# Patient Record
Sex: Female | Born: 1970
Health system: Southern US, Community
[De-identification: ages and names within clinical notes are randomized; demographics above are authoritative.]

## PROBLEM LIST (undated history)

## (undated) DIAGNOSIS — G4720 Circadian rhythm sleep disorder, unspecified type: Secondary | ICD-10-CM

## (undated) DIAGNOSIS — I10 Essential (primary) hypertension: Secondary | ICD-10-CM

## (undated) DIAGNOSIS — F419 Anxiety disorder, unspecified: Secondary | ICD-10-CM

## (undated) DIAGNOSIS — F988 Other specified behavioral and emotional disorders with onset usually occurring in childhood and adolescence: Secondary | ICD-10-CM

## (undated) DIAGNOSIS — R6882 Decreased libido: Secondary | ICD-10-CM

## (undated) HISTORY — DX: Anxiety disorder, unspecified: F41.9

## (undated) HISTORY — DX: Decreased libido: R68.82

## (undated) HISTORY — DX: Circadian rhythm sleep disorder, unspecified type: G47.20

---

## 2009-04-16 HISTORY — PX: BREAST BIOPSY: SHX20

## 2009-06-09 ENCOUNTER — Ambulatory Visit: Payer: Self-pay

## 2009-06-14 ENCOUNTER — Ambulatory Visit: Payer: Self-pay

## 2009-07-06 ENCOUNTER — Ambulatory Visit: Payer: Self-pay | Admitting: Surgery

## 2012-04-02 ENCOUNTER — Ambulatory Visit: Payer: Self-pay | Admitting: Internal Medicine

## 2012-04-15 ENCOUNTER — Ambulatory Visit: Payer: Self-pay | Admitting: Internal Medicine

## 2014-02-12 ENCOUNTER — Ambulatory Visit: Payer: Self-pay | Admitting: Obstetrics and Gynecology

## 2014-02-26 ENCOUNTER — Ambulatory Visit: Payer: Self-pay | Admitting: Obstetrics and Gynecology

## 2014-04-16 LAB — HM MAMMOGRAPHY

## 2014-05-28 LAB — HM PAP SMEAR: HM Pap smear: NEGATIVE

## 2015-05-30 ENCOUNTER — Encounter: Payer: Self-pay | Admitting: *Deleted

## 2015-06-03 ENCOUNTER — Ambulatory Visit (INDEPENDENT_AMBULATORY_CARE_PROVIDER_SITE_OTHER): Payer: 59 | Admitting: Obstetrics and Gynecology

## 2015-06-03 ENCOUNTER — Encounter: Payer: Self-pay | Admitting: Obstetrics and Gynecology

## 2015-06-03 VITALS — BP 150/87 | HR 70 | Ht 63.5 in | Wt 141.0 lb

## 2015-06-03 DIAGNOSIS — Z01419 Encounter for gynecological examination (general) (routine) without abnormal findings: Secondary | ICD-10-CM | POA: Diagnosis not present

## 2015-06-03 NOTE — Progress Notes (Signed)
Subjective:   Jennifer Rowland is a 45 y.o. No obstetric history on file. Caucasian female here for a routine well-woman exam.  Patient's last menstrual period was 05/18/2015 (exact date).    Current complaints: none PCP: me       Does need routine screening labs  Social History: Sexual: heterosexual Marital Status: married Living situation: with spouse Occupation: unknown occupation Tobacco/alcohol: no tobacco use Illicit drugs: no history of illicit drug use  The following portions of the patient's history were reviewed and updated as appropriate: allergies, current medications, past family history, past medical history, past social history, past surgical history and problem list.  Past Medical History Past Medical History  Diagnosis Date  . Anxiety   . Libido, decreased   . Sleep pattern disturbance     Past Surgical History Past Surgical History  Procedure Laterality Date  . Cesarean section  1997    Gynecologic History No obstetric history on file.  Patient's last menstrual period was 05/18/2015 (exact date). Contraception: vasectomy Last Pap: 2016. Results were: normal Last mammogram: 2016. Results were: normal   Obstetric History OB History  No data available    Current Medications No current outpatient prescriptions on file prior to visit.   No current facility-administered medications on file prior to visit.    Review of Systems Patient denies any headaches, blurred vision, shortness of breath, chest pain, abdominal pain, problems with bowel movements, urination, or intercourse.  Objective:  BP 150/87 mmHg  Pulse 70  Ht 5' 3.5" (1.613 m)  Wt 141 lb (63.957 kg)  BMI 24.58 kg/m2  LMP 05/18/2015 (Exact Date) Physical Exam  General:  Well developed, well nourished, no acute distress. She is alert and oriented x3. Skin:  Warm and dry Neck:  Midline trachea, no thyromegaly or nodules Cardiovascular: Regular rate and rhythm, no murmur heard Lungs:   Effort normal, all lung fields clear to auscultation bilaterally Breasts:  No dominant palpable mass, retraction, or nipple discharge Abdomen:  Soft, non tender, no hepatosplenomegaly or masses Pelvic:  External genitalia is normal in appearance.  The vagina is normal in appearance. The cervix is bulbous, no CMT.  Thin prep pap is not done . Uterus is felt to be normal size, shape, and contour.  No adnexal masses or tenderness noted. Extremities:  No swelling or varicosities noted Psych:  She has a normal mood and affect  Assessment:   Healthy well-woman exam  Plan:  Labs obtained F/U 1 year for AE, or sooner if needed Mammogram scheduled  Zafira Munos Rockney Ghee, CNM

## 2015-06-03 NOTE — Patient Instructions (Signed)
  Place annual gynecologic exam patient instructions here.  Thank you for enrolling in [facility]. Please follow the instructions below to securely access your online medical record. MyChart allows you to send messages to your doctor, view your test results, manage appointments, and more.   How Do I Sign Up? 1. In your Internet browser, go to [employer] and enter [email or web address]. 2. Click on the Sign Up Now link in the Sign In box. You will see the New Member Sign Up page. 3. Enter your MyChart Access Code exactly as it appears below. You will not need to use this code after you've completed the sign-up process. If you do not sign up before the expiration date, you must request a new code.  MyChart Access Code: [id number] Expires: 06/25/2015  3:43 PM  4. Enter your Social Security Number ([id number]) and Date of Birth (mm/dd/yyyy) as indicated and click Submit. You will be taken to the next sign-up page. 5. Create a MyChart ID. This will be your MyChart login ID and cannot be changed, so think of one that is secure and easy to remember. 6. Create a MyChart password. You can change your password at any time. 7. Enter your Password Reset Question and Answer. This can be used at a later time if you forget your password.  8. Enter your e-mail address. You will receive e-mail notification when new information is available in [facility]. 9. Click Sign Up. You can now view your medical record.   Additional Information Remember, MyChart is NOT to be used for urgent needs. For medical emergencies, dial 911.

## 2015-06-04 ENCOUNTER — Other Ambulatory Visit: Payer: Self-pay | Admitting: Obstetrics and Gynecology

## 2015-06-04 DIAGNOSIS — E559 Vitamin D deficiency, unspecified: Secondary | ICD-10-CM | POA: Insufficient documentation

## 2015-06-04 LAB — COMPREHENSIVE METABOLIC PANEL
A/G RATIO: 1.3 (ref 1.1–2.5)
ALBUMIN: 3.9 g/dL (ref 3.5–5.5)
ALT: 12 IU/L (ref 0–32)
AST: 13 IU/L (ref 0–40)
Alkaline Phosphatase: 92 IU/L (ref 39–117)
BUN / CREAT RATIO: 11 (ref 9–23)
BUN: 5 mg/dL — ABNORMAL LOW (ref 6–24)
Bilirubin Total: <0.2 mg/dL (ref 0.0–1.2)
CALCIUM: 9.4 mg/dL (ref 8.7–10.2)
CO2: 17 mmol/L — AB (ref 18–29)
CREATININE: 0.47 mg/dL — AB (ref 0.57–1.00)
Chloride: 99 mmol/L (ref 96–106)
GFR, EST AFRICAN AMERICAN: 140 mL/min/{1.73_m2} (ref >59)
GFR, EST NON AFRICAN AMERICAN: 121 mL/min/{1.73_m2} (ref >59)
GLOBULIN, TOTAL: 3 g/dL (ref 1.5–4.5)
Glucose: 64 mg/dL — ABNORMAL LOW (ref 65–99)
Potassium: 4.1 mmol/L (ref 3.5–5.2)
SODIUM: 137 mmol/L (ref 134–144)
Total Protein: 6.9 g/dL (ref 6.0–8.5)

## 2015-06-04 LAB — LIPID PANEL
CHOL/HDL RATIO: 3.1 ratio (ref 0.0–4.4)
Cholesterol, Total: 179 mg/dL (ref 100–199)
HDL: 58 mg/dL (ref >39)
LDL CALC: 94 mg/dL (ref 0–99)
Triglycerides: 135 mg/dL (ref 0–149)
VLDL Cholesterol Cal: 27 mg/dL (ref 5–40)

## 2015-06-04 LAB — VITAMIN D 25 HYDROXY (VIT D DEFICIENCY, FRACTURES): VIT D 25 HYDROXY: 13.1 ng/mL — AB (ref 30.0–100.0)

## 2015-06-04 MED ORDER — VITAMIN D (ERGOCALCIFEROL) 1.25 MG (50000 UNIT) PO CAPS: 50000.0000 [IU] | ORAL_CAPSULE | ORAL | Status: DC

## 2015-06-06 ENCOUNTER — Telehealth: Payer: Self-pay | Admitting: *Deleted

## 2015-06-06 NOTE — Telephone Encounter (Signed)
-----   Message from Joylene Igo, [place] sent at 06/04/2015  1:45 PM EST ----- Please let her know labs look good except vit D is extremely low, RX sent in for twice weekly supplement and want to recheck levels in 3 months

## 2015-06-06 NOTE — Telephone Encounter (Signed)
Mailed all info to pt about labs and mailed Vit D pamphlet

## 2015-07-01 ENCOUNTER — Ambulatory Visit
Admission: RE | Admit: 2015-07-01 | Discharge: 2015-07-01 | Disposition: A | Discharge status: Home / Self Care | Payer: 59 | Source: Ambulatory Visit | Attending: Obstetrics and Gynecology | Admitting: Obstetrics and Gynecology

## 2015-07-01 DIAGNOSIS — Z1231 Encounter for screening mammogram for malignant neoplasm of breast: Secondary | ICD-10-CM | POA: Insufficient documentation

## 2015-07-01 DIAGNOSIS — Z01419 Encounter for gynecological examination (general) (routine) without abnormal findings: Secondary | ICD-10-CM

## 2015-07-08 ENCOUNTER — Encounter: Payer: Self-pay | Admitting: Obstetrics and Gynecology

## 2015-07-08 ENCOUNTER — Other Ambulatory Visit: Payer: Self-pay | Admitting: Obstetrics and Gynecology

## 2015-07-08 MED ORDER — TRIAZOLAM 0.25 MG PO TABS: 0.2500 mg | ORAL_TABLET | Freq: Every evening | ORAL | Status: DC | PRN

## 2015-10-26 ENCOUNTER — Telehealth: Payer: Self-pay | Admitting: Obstetrics and Gynecology

## 2015-10-26 NOTE — Telephone Encounter (Signed)
Left detailed message about vit d

## 2015-10-26 NOTE — Telephone Encounter (Signed)
Pt wants to come Friday to check her vit D again

## 2015-10-28 ENCOUNTER — Other Ambulatory Visit: Payer: 59

## 2015-10-28 DIAGNOSIS — E559 Vitamin D deficiency, unspecified: Secondary | ICD-10-CM

## 2015-10-29 LAB — VITAMIN D 25 HYDROXY (VIT D DEFICIENCY, FRACTURES): Vit D, 25-Hydroxy: 73.9 ng/mL (ref 30.0–100.0)

## 2015-11-17 ENCOUNTER — Other Ambulatory Visit: Payer: Self-pay | Admitting: *Deleted

## 2015-11-17 MED ORDER — NITROFURANTOIN MONOHYD MACRO 100 MG PO CAPS: 100.0000 mg | ORAL_CAPSULE | Freq: Two times a day (BID) | ORAL | 0 refills | Status: DC

## 2016-03-06 ENCOUNTER — Other Ambulatory Visit: Payer: Self-pay | Admitting: Obstetrics and Gynecology

## 2016-03-06 ENCOUNTER — Encounter: Payer: Self-pay | Admitting: Obstetrics and Gynecology

## 2016-03-07 ENCOUNTER — Other Ambulatory Visit: Payer: Self-pay | Admitting: Obstetrics and Gynecology

## 2016-03-07 MED ORDER — TRIAZOLAM 0.25 MG PO TABS: 0.2500 mg | ORAL_TABLET | Freq: Every evening | ORAL | 0 refills | Status: DC | PRN

## 2016-05-16 ENCOUNTER — Telehealth: Payer: Self-pay | Admitting: Obstetrics and Gynecology

## 2016-05-16 ENCOUNTER — Other Ambulatory Visit: Payer: Self-pay | Admitting: Obstetrics and Gynecology

## 2016-05-16 ENCOUNTER — Telehealth: Payer: 59 | Admitting: Family

## 2016-05-16 DIAGNOSIS — R399 Unspecified symptoms and signs involving the genitourinary system: Secondary | ICD-10-CM

## 2016-05-16 DIAGNOSIS — M545 Low back pain, unspecified: Secondary | ICD-10-CM

## 2016-05-16 MED ORDER — NITROFURANTOIN MONOHYD MACRO 100 MG PO CAPS: 100.0000 mg | ORAL_CAPSULE | Freq: Two times a day (BID) | ORAL | 2 refills | Status: DC

## 2016-05-16 NOTE — Progress Notes (Signed)
Based on what you shared with me it looks like you have a serious condition that should be evaluated in a face to face office visit.  NOTE: Even if you have entered your credit card information for this eVisit, you will not be charged.   If you are having a true medical emergency please call 911.  If you need an urgent face to face visit, [facility] has four urgent care centers for your convenience.  If you need care fast and have a high deductible or no insurance consider:   [email or web address]  [phone]  [address], [address] [place], [place] [address] 8 am to 8 pm Monday-Friday 10 am to 4 pm Saturday-Sunday   The following sites will take your  insurance:    . [facility]  [phone] Get Driving Directions Find a Provider at this Location  [address] [place], [place] [address] . 10 am to 8 pm Monday-Friday . 12 pm to 8 pm Saturday-Sunday   . [facility]  [phone] Get Driving Directions Find a Provider at this Location  [address], [address] [place], [place] [address] . 8 am to 8 pm Monday-Friday . 9 am to 6 pm Saturday . 11 am to 6 pm Sunday   .  [facility] [place]  [phone] Get Driving Directions  [address] [place], [place] [address] . 8 am to 8 pm Monday-Friday . 8 am to 4 pm Saturday-Sunday   Your e-visit answers were reviewed by a board certified advanced clinical practitioner to complete your personal care plan.  Thank you for using e-Visits.  

## 2016-05-16 NOTE — Telephone Encounter (Signed)
Pt sent a my chart message in about a refill on the Macrobid, she wasn't sure if it went thru, but she thinks she has a UTI and that's why she sent a refill req in for the [place], do we needs her to come in a drop off a urine or does she need to make appt. Pt wasn't sure and wanted a call back.

## 2016-05-16 NOTE — Telephone Encounter (Signed)
Notified pt rx sent.

## 2016-05-17 ENCOUNTER — Ambulatory Visit (INDEPENDENT_AMBULATORY_CARE_PROVIDER_SITE_OTHER): Payer: 59 | Admitting: Obstetrics and Gynecology

## 2016-05-17 ENCOUNTER — Encounter: Payer: Self-pay | Admitting: Obstetrics and Gynecology

## 2016-05-17 ENCOUNTER — Other Ambulatory Visit: Payer: Self-pay | Admitting: Obstetrics and Gynecology

## 2016-05-17 ENCOUNTER — Ambulatory Visit
Admission: RE | Admit: 2016-05-17 | Discharge: 2016-05-17 | Disposition: A | Discharge status: Home / Self Care | Payer: 59 | Source: Ambulatory Visit | Attending: Obstetrics and Gynecology | Admitting: Obstetrics and Gynecology

## 2016-05-17 VITALS — BP 148/90 | HR 88 | Ht 63.5 in | Wt 148.9 lb

## 2016-05-17 DIAGNOSIS — R109 Unspecified abdominal pain: Secondary | ICD-10-CM | POA: Insufficient documentation

## 2016-05-17 DIAGNOSIS — N2889 Other specified disorders of kidney and ureter: Secondary | ICD-10-CM | POA: Insufficient documentation

## 2016-05-17 DIAGNOSIS — N200 Calculus of kidney: Secondary | ICD-10-CM

## 2016-05-17 MED ORDER — TAMSULOSIN HCL 0.4 MG PO CAPS: 0.4000 mg | ORAL_CAPSULE | Freq: Every day | ORAL | 0 refills | Status: DC

## 2016-05-17 MED ORDER — OXYCODONE-ACETAMINOPHEN 5-325 MG PO TABS: 1.0000 | ORAL_TABLET | Freq: Four times a day (QID) | ORAL | 0 refills | Status: DC | PRN

## 2016-05-17 MED ORDER — IBUPROFEN 800 MG PO TABS: 800.0000 mg | ORAL_TABLET | Freq: Three times a day (TID) | ORAL | 1 refills | Status: DC | PRN

## 2016-05-17 MED ORDER — NITROFURANTOIN MONOHYD MACRO 100 MG PO CAPS: 100.0000 mg | ORAL_CAPSULE | Freq: Two times a day (BID) | ORAL | 1 refills | Status: DC

## 2016-05-17 NOTE — Progress Notes (Signed)
Subjective:     Patient ID: Jennifer Rowland, female   DOB: [birth year], 46 y.o.   MRN: [id number]  HPI reports sudden onset right mid-back pain last night, wilt persistant pain since, sharpe pains with movement, no relief with OTC NSAIDS or heating pad. Denies any burning with urine or changes in BMs. Denies pain like this in the past. Unable to walk without supporting back.  Review of Systems Negative except stated above in HPI    Objective:   Physical Exam A&O x4 Well groomed female in distress Blood pressure (!) 148/90, pulse 88, height 5' 3.5" (1.613 m), weight 148 lb 14.4 oz (67.5 kg), last menstrual period 04/28/2016. UA contaminated by AZO, sent for culture + right flank pain, exam otherwise normal.    Assessment:     Right flank pain    Plan:     Renal ultrasound today- will follow up accordingly. rx for percocet and motrin, with antibiotic  Melody Shelby, CNM

## 2016-05-17 NOTE — Patient Instructions (Signed)
Renal Colic Renal colic is pain that is caused by passing a kidney stone. The pain can be sharp and severe. It may be felt in the back, abdomen, side (flank), or groin. It can cause nausea. Renal colic can come and go. Follow these instructions at home: Watch your condition for any changes. The following actions may help to lessen any discomfort that you are feeling:  Take medicines only as directed by your health care provider.  Ask your health care provider if it is okay to take over-the-counter pain medicine.  Drink enough fluid to keep your urine clear or pale yellow. Drink 6-8 glasses of water each day.  Limit the amount of salt that you eat to less than 2 grams per day.  Reduce the amount of protein in your diet. Eat less meat, fish, nuts, and dairy.  Avoid foods such as spinach, rhubarb, nuts, or bran. These may make kidney stones more likely to form. Contact a health care provider if:  You have a fever or chills.  Your urine smells bad or looks cloudy.  You have pain or burning when you pass urine. Get help right away if:  Your flank pain or groin pain suddenly worsens.  You become confused or disoriented or you lose consciousness. This information is not intended to replace advice given to you by your health care provider. Make sure you discuss any questions you have with your health care provider. Document Released: 01/10/2005 Document Revised: 09/06/2015 Document Reviewed: 02/10/2014 Elsevier Interactive Patient Education  2017 [employer].

## 2016-05-18 ENCOUNTER — Other Ambulatory Visit: Payer: Self-pay | Admitting: Obstetrics and Gynecology

## 2016-05-18 ENCOUNTER — Other Ambulatory Visit: Payer: Self-pay | Admitting: *Deleted

## 2016-05-18 ENCOUNTER — Encounter: Payer: Self-pay | Admitting: Obstetrics and Gynecology

## 2016-05-18 DIAGNOSIS — Z1231 Encounter for screening mammogram for malignant neoplasm of breast: Secondary | ICD-10-CM

## 2016-05-19 LAB — URINE CULTURE: ORGANISM ID, BACTERIA: NO GROWTH

## 2016-05-20 ENCOUNTER — Other Ambulatory Visit: Payer: Self-pay | Admitting: Obstetrics and Gynecology

## 2016-06-01 ENCOUNTER — Encounter: Payer: Self-pay | Admitting: Urology

## 2016-06-01 ENCOUNTER — Ambulatory Visit (INDEPENDENT_AMBULATORY_CARE_PROVIDER_SITE_OTHER): Payer: 59 | Admitting: Urology

## 2016-06-01 ENCOUNTER — Other Ambulatory Visit
Admission: RE | Admit: 2016-06-01 | Discharge: 2016-06-01 | Disposition: A | Discharge status: Home / Self Care | Payer: 59 | Source: Ambulatory Visit | Attending: Urology | Admitting: Urology

## 2016-06-01 VITALS — BP 136/82 | HR 85 | Ht 63.0 in | Wt 148.5 lb

## 2016-06-01 DIAGNOSIS — R3129 Other microscopic hematuria: Secondary | ICD-10-CM | POA: Insufficient documentation

## 2016-06-01 DIAGNOSIS — R109 Unspecified abdominal pain: Secondary | ICD-10-CM | POA: Diagnosis not present

## 2016-06-01 DIAGNOSIS — N200 Calculus of kidney: Secondary | ICD-10-CM | POA: Diagnosis not present

## 2016-06-01 LAB — URINALYSIS, COMPLETE (UACMP) WITH MICROSCOPIC
Bilirubin Urine: NEGATIVE
GLUCOSE, UA: NEGATIVE mg/dL
HGB URINE DIPSTICK: NEGATIVE
Ketones, ur: NEGATIVE mg/dL
LEUKOCYTES UA: NEGATIVE
Nitrite: NEGATIVE
PH: 5.5 (ref 5.0–8.0)
Protein, ur: NEGATIVE mg/dL
Specific Gravity, Urine: >1.03 — ABNORMAL HIGH (ref 1.005–1.030)

## 2016-06-01 LAB — CREATININE, SERUM
Creatinine, Ser: 0.78 mg/dL (ref 0.44–1.00)
GFR calc Af Amer: >60 mL/min (ref >60)
GFR calc non Af Amer: >60 mL/min (ref >60)

## 2016-06-01 LAB — PREGNANCY, URINE: PREG TEST UR: NEGATIVE

## 2016-06-01 LAB — BUN: BUN: 15 mg/dL (ref 6–20)

## 2016-06-01 NOTE — Progress Notes (Signed)
06/01/2016 11:16 AM   Williemae Area Schurman Mar 08, 1971 [id number]  Referring provider: Joylene Igo, CNM [address] [place], [place] [address]  Chief Complaint  Patient presents with  . New Patient (Initial Visit)    kidney stone referred by [facility]     HPI: Patient is a 46 year old Caucasian female who presents today as a referral by Joylene Igo, CNM, for possible kidney stone.  She states that 2 weeks ago she experienced intense right mid back pain. It was very sharp in nature. There is no relief with NSAIDs or heating pad. She rated the pain as 7 out of 10. She did not note anything that made the pain worse.  She states that the pain is somewhat better.  She is experiencing frequency, urgency, nocturia and incontinence. She states that these are baseline symptoms.  RUS performed on 05/17/2016 noted there is borderline caliectasis on the right which persists even after voiding, but no overt hydronephrosis. Moreover, there is a right ureteral jet in the urinary bladder, which certainly argues against high-grade obstruction. Otherwise normal exam.  I have independently reviewed the films.  Her UA today noted 0-5 RBC's and 0-5 WBC's.    PMH: Past Medical History:  Diagnosis Date  . Anxiety   . Libido, decreased   . Sleep pattern disturbance     Surgical History: Past Surgical History:  Procedure Laterality Date  . BREAST BIOPSY Right 2011   CORE W/CLIP - NEG  . CESAREAN SECTION  1997    Home Medications:  Allergies as of 06/01/2016      Reactions   Percocet [oxycodone-acetaminophen]       Medication List       Accurate as of 06/01/16 11:16 AM. Always use your most recent med list.          ibuprofen 800 MG tablet Commonly known as:  ADVIL,MOTRIN Take 1 tablet (800 mg total) by mouth every 8 (eight) hours as needed.   MULTIVITAL tablet Take 1 tablet by mouth daily.   nitrofurantoin (macrocrystal-monohydrate) 100 MG  capsule Commonly known as:  MACROBID Take 1 capsule (100 mg total) by mouth 2 (two) times daily.   nitrofurantoin (macrocrystal-monohydrate) 100 MG capsule Commonly known as:  MACROBID Take 1 capsule (100 mg total) by mouth 2 (two) times daily.   oxyCODONE-acetaminophen 5-325 MG tablet Commonly known as:  PERCOCET/ROXICET Take 1-2 tablets by mouth every 6 (six) hours as needed.   tamsulosin 0.4 MG Caps capsule Commonly known as:  FLOMAX Take 1 capsule (0.4 mg total) by mouth daily.   triazolam 0.25 MG tablet Commonly known as:  HALCION Take 1 tablet (0.25 mg total) by mouth at bedtime as needed for sleep.   vitamin B-12 1000 MCG tablet Commonly known as:  CYANOCOBALAMIN Take 1,500 mcg by mouth daily.   Vitamin D (Ergocalciferol) 50000 units Caps capsule Commonly known as:  DRISDOL Take 1 capsule (50,000 Units total) by mouth 2 (two) times a week.       Allergies:  Allergies  Allergen Reactions  . Percocet [Oxycodone-Acetaminophen]     Family History: Family History  Problem Relation Age of Onset  . Heart disease Father   . Cancer Maternal Grandmother     ovarian  . Diabetes Maternal Grandmother   . Prostate cancer Maternal Grandfather   . Diabetes Maternal Grandfather   . Breast cancer Neg Hx   . Kidney disease Neg Hx   . Kidney cancer Neg Hx   .  Bladder Cancer Neg Hx     Social History:  reports that she has never smoked. She has never used smokeless tobacco. She reports that she drinks alcohol. She reports that she does not use drugs.  ROS: UROLOGY Frequent Urination?: Yes Hard to postpone urination?: Yes Burning/pain with urination?: No Get up at night to urinate?: Yes Leakage of urine?: Yes Urine stream starts and stops?: No Trouble starting stream?: No Do you have to strain to urinate?: No Blood in urine?: No Urinary tract infection?: No Sexually transmitted disease?: No Injury to kidneys or bladder?: No Painful intercourse?: Yes Weak stream?:  No Currently pregnant?: No Vaginal bleeding?: Yes Last menstrual period?: 05/19/2016  Gastrointestinal Nausea?: No Vomiting?: No Indigestion/heartburn?: No Diarrhea?: No Constipation?: No  Constitutional Fever: No Night sweats?: Yes Weight loss?: No Fatigue?: Yes  Skin Skin rash/lesions?: No Itching?: No  Eyes Blurred vision?: No Double vision?: No  Ears/Nose/Throat Sore throat?: No Sinus problems?: No  Hematologic/Lymphatic Swollen glands?: No Easy bruising?: No  Cardiovascular Leg swelling?: No Chest pain?: No  Respiratory Cough?: No Shortness of breath?: No  Endocrine Excessive thirst?: Yes  Musculoskeletal Back pain?: Yes Joint pain?: No  Neurological Headaches?: Yes Dizziness?: Yes  Psychologic Depression?: No Anxiety?: Yes  Physical Exam: BP 136/82   Pulse 85   Ht 5\' 3"  (1.6 m)   Wt 148 lb 8 oz (67.4 kg)   LMP 05/19/2016   BMI 26.31 kg/m   Constitutional: Well nourished. Alert and oriented, No acute distress. HEENT:  AT, moist mucus membranes. Trachea midline, no masses. Cardiovascular: No clubbing, cyanosis, or edema. Respiratory: Normal respiratory effort, no increased work of breathing. GI: Abdomen is soft, non tender, non distended, no abdominal masses. Liver and spleen not palpable.  No hernias appreciated.  Stool sample for occult testing is not indicated.   GU: No CVA tenderness.  No bladder fullness or masses.   Skin: No rashes, bruises or suspicious lesions. Lymph: No cervical or inguinal adenopathy. Neurologic: Grossly intact, no focal deficits, moving all 4 extremities. Psychiatric: Normal mood and affect.  Laboratory Data:  Lab Results  Component Value Date   CREATININE 0.47 (L) 06/03/2015       Component Value Date/Time   CHOL 179 06/03/2015 1053   HDL 58 06/03/2015 1053   CHOLHDL 3.1 06/03/2015 1053   LDLCALC 94 06/03/2015 1053    Lab Results  Component Value Date   AST 13 06/03/2015   Lab Results    Component Value Date   ALT 12 06/03/2015    Urinalysis    Component Value Date/Time   COLORURINE YELLOW 06/01/2016 1025   APPEARANCEUR CLEAR 06/01/2016 1025   LABSPEC >1.030 (H) 06/01/2016 1025   PHURINE 5.5 06/01/2016 1025   GLUCOSEU NEGATIVE 06/01/2016 1025   HGBUR NEGATIVE 06/01/2016 1025   BILIRUBINUR NEGATIVE 06/01/2016 1025   KETONESUR NEGATIVE 06/01/2016 1025   PROTEINUR NEGATIVE 06/01/2016 1025   NITRITE NEGATIVE 06/01/2016 1025   LEUKOCYTESUR NEGATIVE 06/01/2016 1025    Pertinent Imaging: CLINICAL DATA:  Right flank pain for 2 days.  EXAM: RENAL / URINARY TRACT ULTRASOUND COMPLETE  COMPARISON:  None.  FINDINGS: Right Kidney:  Length: 11.2 cm. Echogenicity within normal limits. No mass observed. There is borderline caliectasis but no overt hydronephrosis. The borderline caliectasis persists even after voiding.  Left Kidney:  Length: 10.5 cm. Echogenicity within normal limits. No mass or hydronephrosis visualized.  Bladder:  Appears normal for degree of bladder distention.  IMPRESSION: 1. There is borderline caliectasis on the right  which persists even after voiding, but no overt hydronephrosis. Moreover, there is a right ureteral jet in the urinary bladder, which certainly argues against high-grade obstruction. Otherwise normal exam.   Electronically Signed   By: Van Clines M.D.   On: 05/17/2016 15:55   Assessment & Plan:    1. Microscopic hematuria  - I explained to the patient that there are a number of causes that can be associated with blood in the urine, such as stones, UTI's, damage to the urinary tract and/or cancer.  - At this time, I felt that the patient warranted further urologic evaluation.   The AUA guidelines state that a CT urogram is the preferred imaging study to evaluate hematuria.  - I explained to the patient that a contrast material will be injected into a vein and that in rare instances, an allergic  reaction can result and may even life threatening   The patient denies any allergies to contrast, iodine and/or seafood and is not taking metformin.  - Her reproductive status is unknown at this time.  We will obtain a serum pregnancy test today.   - The patient had the opportunity to ask questions which were answered. Based upon this discussion, the patient is willing to proceed. Therefore, I've ordered: a CT Urogram  - The patient will return following all of the above for discussion of the results.   - UA  - Urine culture  - BUN + creatinine    2. Right flank pain  - ? Passage of stone  - CT Urogram pending  - Advised to contact our office or seek treatment in the ED if becomes febrile or pain/ vomiting are difficult control in order to arrange for emergent/urgent intervention    Return for CT Urogram report.  These notes generated with voice recognition software. I apologize for typographical errors.  Zara Council, [facility] [address] [place], [place] [address] [phone]

## 2016-06-02 LAB — URINE CULTURE: Culture: NO GROWTH

## 2016-06-04 ENCOUNTER — Telehealth: Payer: Self-pay

## 2016-06-04 NOTE — Telephone Encounter (Signed)
Patient notified on vmail 

## 2016-06-04 NOTE — Telephone Encounter (Signed)
-----   Message from Nori Riis, PA-C sent at 06/03/2016 10:43 AM EST ----- Please notify the patient that her urine culture was negative.  We need to proceed with the CT Urogram.

## 2016-06-07 ENCOUNTER — Telehealth: Payer: Self-pay | Admitting: *Deleted

## 2016-06-07 NOTE — Telephone Encounter (Signed)
LMOM to call office back.

## 2016-06-07 NOTE — Telephone Encounter (Signed)
-----   Message from Nori Riis, PA-C sent at 06/03/2016 10:43 AM EST ----- Please notify the patient that her urine culture was negative.  We need to proceed with the CT Urogram.

## 2016-06-07 NOTE — Telephone Encounter (Signed)
Per Sarah watts message already handled.

## 2016-06-08 ENCOUNTER — Encounter: Payer: Self-pay | Admitting: Obstetrics and Gynecology

## 2016-06-08 ENCOUNTER — Ambulatory Visit (INDEPENDENT_AMBULATORY_CARE_PROVIDER_SITE_OTHER): Payer: 59 | Admitting: Obstetrics and Gynecology

## 2016-06-08 ENCOUNTER — Ambulatory Visit
Admission: RE | Admit: 2016-06-08 | Discharge: 2016-06-08 | Disposition: A | Discharge status: Home / Self Care | Payer: Commercial Managed Care - HMO | Source: Ambulatory Visit | Attending: Urology | Admitting: Urology

## 2016-06-08 VITALS — BP 148/90 | HR 98 | Ht 63.0 in | Wt 146.5 lb

## 2016-06-08 DIAGNOSIS — G479 Sleep disorder, unspecified: Secondary | ICD-10-CM

## 2016-06-08 DIAGNOSIS — R3129 Other microscopic hematuria: Secondary | ICD-10-CM | POA: Diagnosis present

## 2016-06-08 DIAGNOSIS — I700 Atherosclerosis of aorta: Secondary | ICD-10-CM | POA: Insufficient documentation

## 2016-06-08 DIAGNOSIS — R41840 Attention and concentration deficit: Secondary | ICD-10-CM

## 2016-06-08 DIAGNOSIS — Z01419 Encounter for gynecological examination (general) (routine) without abnormal findings: Secondary | ICD-10-CM

## 2016-06-08 MED ORDER — IOPAMIDOL (ISOVUE-300) INJECTION 61%: 125.0000 mL | Freq: Once | INTRAVENOUS | Status: DC | PRN

## 2016-06-08 MED ORDER — TRIAZOLAM 0.25 MG PO TABS: 0.2500 mg | ORAL_TABLET | Freq: Every evening | ORAL | 0 refills | Status: DC | PRN

## 2016-06-08 MED ORDER — IOPAMIDOL (ISOVUE-300) INJECTION 61%
150.0000 mL | Freq: Once | INTRAVENOUS | Status: AC | PRN
  Administered 2016-06-08: 150 mL via INTRAVENOUS

## 2016-06-08 NOTE — Progress Notes (Signed)
Subjective:   Jennifer Rowland is a 46 y.o. G66P2 Caucasian female here for a routine well-woman exam.  Patient's last menstrual period was 05/19/2016.    Current complaints: worse problems focusing,  PCP: me       does desire labs  Social History: Sexual: heterosexual Marital Status: married Living situation: with family Occupation: unknown occupation Tobacco/alcohol: no tobacco use Illicit drugs: no history of illicit drug use  The following portions of the patient's history were reviewed and updated as appropriate: allergies, current medications, past family history, past medical history, past social history, past surgical history and problem list.  Past Medical History Past Medical History:  Diagnosis Date  . Anxiety   . Libido, decreased   . Sleep pattern disturbance     Past Surgical History Past Surgical History:  Procedure Laterality Date  . BREAST BIOPSY Right 2011   CORE W/CLIP - NEG  . CESAREAN SECTION  1997    Gynecologic History G2P2  Patient's last menstrual period was 05/19/2016. Contraception: vasectomy Last Pap: 2016. Results were: normal Last mammogram: 2017. Results were: normal   Obstetric History OB History  Gravida Para Term Preterm AB Living  2 2          SAB TAB Ectopic Multiple Live Births               # Outcome Date GA Lbr Len/2nd Weight Sex Delivery Anes PTL Lv  2 Para 1997    F CS-Unspec     1 Para 1994    M Vag-Spont         Current Medications Current Outpatient Prescriptions on File Prior to Visit  Medication Sig Dispense Refill  . Multiple Vitamins-Minerals (MULTIVITAL) tablet Take 1 tablet by mouth daily.    . triazolam (HALCION) 0.25 MG tablet Take 1 tablet (0.25 mg total) by mouth at bedtime as needed for sleep. 30 tablet 0  . vitamin B-12 (CYANOCOBALAMIN) 1000 MCG tablet Take 1,500 mcg by mouth daily.     . Vitamin D, Ergocalciferol, (DRISDOL) 50000 units CAPS capsule Take 1 capsule (50,000 Units total) by mouth 2 (two)  times a week. 30 capsule 2  . nitrofurantoin, macrocrystal-monohydrate, (MACROBID) 100 MG capsule Take 1 capsule (100 mg total) by mouth 2 (two) times daily. (Patient not taking: Reported on 06/01/2016) 14 capsule 2   No current facility-administered medications on file prior to visit.     Review of Systems Patient denies any headaches, blurred vision, shortness of breath, chest pain, abdominal pain, problems with bowel movements, urination, or intercourse.  Objective:  BP (!) 148/90   Pulse 98   Ht 5\' 3"  (1.6 m)   Wt 146 lb 8 oz (66.5 kg)   LMP 05/19/2016   BMI 25.95 kg/m  Physical Exam  General:  Well developed, well nourished, no acute distress. She is alert and oriented x3. Skin:  Warm and dry Neck:  Midline trachea, no thyromegaly or nodules Cardiovascular: Regular rate and rhythm, no murmur heard Lungs:  Effort normal, all lung fields clear to auscultation bilaterally Breasts:  No dominant palpable mass, retraction, or nipple discharge Abdomen:  Soft, non tender, no hepatosplenomegaly or masses Pelvic:  External genitalia is normal in appearance.  The vagina is normal in appearance. The cervix is bulbous, no CMT.  Thin prep pap is not done . Uterus is felt to be normal size, shape, and contour.  No adnexal masses or tenderness noted. Extremities:  No swelling or varicosities noted Psych:  She has  a normal mood and affect  Assessment:   Healthy well-woman exam H/O vit D deficiency Recent renal stone Difficulty focusing Sleep disturbance  Plan:  Referred to focus MD for evaluation. Will hold vit D till labs return F/U 1 year for AE, or sooner if needed Mammogram ordered  Henery Betzold Rockney Ghee, CNM

## 2016-06-08 NOTE — Patient Instructions (Signed)
 Preventive Care 18-39 Years, Female Preventive care refers to lifestyle choices and visits with your health care provider that can promote health and wellness. What does preventive care include?  A yearly physical exam. This is also called an annual well check.  Dental exams once or twice a year.  Routine eye exams. Ask your health care provider how often you should have your eyes checked.  Personal lifestyle choices, including:  Daily care of your teeth and gums.  Regular physical activity.  Eating a healthy diet.  Avoiding tobacco and drug use.  Limiting alcohol use.  Practicing safe sex.  Taking vitamin and mineral supplements as recommended by your health care provider. What happens during an annual well check? The services and screenings done by your health care provider during your annual well check will depend on your age, overall health, lifestyle risk factors, and family history of disease. Counseling  Your health care provider may ask you questions about your:  Alcohol use.  Tobacco use.  Drug use.  Emotional well-being.  Home and relationship well-being.  Sexual activity.  Eating habits.  Work and work environment.  Method of birth control.  Menstrual cycle.  Pregnancy history. Screening  You may have the following tests or measurements:  Height, weight, and BMI.  Diabetes screening. This is done by checking your blood sugar (glucose) after you have not eaten for a while (fasting).  Blood pressure.  Lipid and cholesterol levels. These may be checked every 5 years starting at age 20.  Skin check.  Hepatitis C blood test.  Hepatitis B blood test.  Sexually transmitted disease (STD) testing.  BRCA-related cancer screening. This may be done if you have a family history of breast, ovarian, tubal, or peritoneal cancers.  Pelvic exam and Pap test. This may be done every 3 years starting at age 21. Starting at age 30, this may be done  every 5 years if you have a Pap test in combination with an HPV test. Discuss your test results, treatment options, and if necessary, the need for more tests with your health care provider. Vaccines  Your health care provider may recommend certain vaccines, such as:  Influenza vaccine. This is recommended every year.  Tetanus, diphtheria, and acellular pertussis (Tdap, Td) vaccine. You may need a Td booster every 10 years.  Varicella vaccine. You may need this if you have not been vaccinated.  HPV vaccine. If you are 26 or younger, you may need three doses over 6 months.  Measles, mumps, and rubella (MMR) vaccine. You may need at least one dose of MMR. You may also need a second dose.  Pneumococcal 13-valent conjugate (PCV13) vaccine. You may need this if you have certain conditions and were not previously vaccinated.  Pneumococcal polysaccharide (PPSV23) vaccine. You may need one or two doses if you smoke cigarettes or if you have certain conditions.  Meningococcal vaccine. One dose is recommended if you are age 19-21 years and a first-year college student living in a residence hall, or if you have one of several medical conditions. You may also need additional booster doses.  Hepatitis A vaccine. You may need this if you have certain conditions or if you travel or work in places where you may be exposed to hepatitis A.  Hepatitis B vaccine. You may need this if you have certain conditions or if you travel or work in places where you may be exposed to hepatitis B.  Haemophilus influenzae type b (Hib) vaccine. You may need   this if you have certain risk factors. Talk to your health care provider about which screenings and vaccines you need and how often you need them. This information is not intended to replace advice given to you by your health care provider. Make sure you discuss any questions you have with your health care provider. Document Released: 05/29/2001 Document Revised:  12/21/2015 Document Reviewed: 02/01/2015 Elsevier Interactive Patient Education  2017 Elsevier Inc.  

## 2016-06-09 LAB — B12 AND FOLATE PANEL: VITAMIN B 12: 674 pg/mL (ref 232–1245)

## 2016-06-09 LAB — COMPREHENSIVE METABOLIC PANEL
A/G RATIO: 1.7 (ref 1.2–2.2)
ALT: 27 IU/L (ref 0–32)
AST: 14 IU/L (ref 0–40)
Albumin: 4.5 g/dL (ref 3.5–5.5)
Alkaline Phosphatase: 77 IU/L (ref 39–117)
BILIRUBIN TOTAL: 0.4 mg/dL (ref 0.0–1.2)
BUN/Creatinine Ratio: 15 (ref 9–23)
BUN: 12 mg/dL (ref 6–24)
CALCIUM: 8.9 mg/dL (ref 8.7–10.2)
CHLORIDE: 102 mmol/L (ref 96–106)
CO2: 23 mmol/L (ref 18–29)
Creatinine, Ser: 0.81 mg/dL (ref 0.57–1.00)
GFR calc Af Amer: 102 mL/min/{1.73_m2} (ref >59)
GFR calc non Af Amer: 89 mL/min/{1.73_m2} (ref >59)
GLUCOSE: 83 mg/dL (ref 65–99)
Globulin, Total: 2.7 g/dL (ref 1.5–4.5)
POTASSIUM: 4.3 mmol/L (ref 3.5–5.2)
Sodium: 139 mmol/L (ref 134–144)
Total Protein: 7.2 g/dL (ref 6.0–8.5)

## 2016-06-09 LAB — FERRITIN: Ferritin: 25 ng/mL (ref 15–150)

## 2016-06-09 LAB — CBC
HEMATOCRIT: 40.2 % (ref 34.0–46.6)
Hemoglobin: 13.3 g/dL (ref 11.1–15.9)
MCH: 28.9 pg (ref 26.6–33.0)
MCHC: 33.1 g/dL (ref 31.5–35.7)
MCV: 87 fL (ref 79–97)
Platelets: 289 10*3/uL (ref 150–379)
RBC: 4.61 x10E6/uL (ref 3.77–5.28)
RDW: 13.7 % (ref 12.3–15.4)
WBC: 8.3 10*3/uL (ref 3.4–10.8)

## 2016-06-09 LAB — LIPID PANEL
CHOL/HDL RATIO: 2.9 ratio (ref 0.0–4.4)
Cholesterol, Total: 172 mg/dL (ref 100–199)
HDL: 59 mg/dL (ref >39)
LDL CALC: 103 mg/dL — AB (ref 0–99)
TRIGLYCERIDES: 52 mg/dL (ref 0–149)
VLDL Cholesterol Cal: 10 mg/dL (ref 5–40)

## 2016-06-09 LAB — TSH: TSH: 1.41 u[IU]/mL (ref 0.450–4.500)

## 2016-06-09 LAB — VITAMIN D 25 HYDROXY (VIT D DEFICIENCY, FRACTURES): VIT D 25 HYDROXY: 41.5 ng/mL (ref 30.0–100.0)

## 2016-06-14 ENCOUNTER — Other Ambulatory Visit: Payer: Self-pay | Admitting: *Deleted

## 2016-06-14 DIAGNOSIS — R3129 Other microscopic hematuria: Secondary | ICD-10-CM

## 2016-06-15 ENCOUNTER — Encounter: Payer: Self-pay | Admitting: Obstetrics and Gynecology

## 2016-06-15 ENCOUNTER — Other Ambulatory Visit
Admission: RE | Admit: 2016-06-15 | Discharge: 2016-06-15 | Disposition: A | Discharge status: Home / Self Care | Payer: 59 | Source: Ambulatory Visit | Attending: Urology | Admitting: Urology

## 2016-06-15 ENCOUNTER — Ambulatory Visit (INDEPENDENT_AMBULATORY_CARE_PROVIDER_SITE_OTHER): Payer: 59 | Admitting: Urology

## 2016-06-15 ENCOUNTER — Encounter: Payer: Self-pay | Admitting: Urology

## 2016-06-15 VITALS — BP 146/83 | HR 61 | Ht 63.0 in | Wt 150.0 lb

## 2016-06-15 DIAGNOSIS — R109 Unspecified abdominal pain: Secondary | ICD-10-CM | POA: Diagnosis not present

## 2016-06-15 DIAGNOSIS — R3129 Other microscopic hematuria: Secondary | ICD-10-CM | POA: Insufficient documentation

## 2016-06-15 LAB — URINALYSIS, COMPLETE (UACMP) WITH MICROSCOPIC
BILIRUBIN URINE: NEGATIVE
Bacteria, UA: NONE SEEN
Glucose, UA: NEGATIVE mg/dL
Ketones, ur: NEGATIVE mg/dL
LEUKOCYTES UA: NEGATIVE
NITRITE: NEGATIVE
PROTEIN: NEGATIVE mg/dL
Specific Gravity, Urine: 1.025 (ref 1.005–1.030)
pH: 7 (ref 5.0–8.0)

## 2016-06-15 NOTE — Progress Notes (Signed)
06/15/2016 11:35 AM   Jennifer Rowland 1970-11-29 [id number]  Referring provider: Joylene Igo, CNM [place],  [place] [address]  Chief Complaint  Patient presents with  . Results    CT     HPI: 46 yo WF who presents today to discuss her CT Urogram performed to evaluate for [facility] and possible right hydronephrosis.    Background history Patient is a 46 year old Caucasian female who presents today as a referral by Joylene Igo, CNM, for possible kidney stone.  She states that 2 weeks ago she experienced intense right mid back pain. It was very sharp in nature. There is no relief with NSAIDs or heating pad. She rated the pain as 7 out of 10. She did not note anything that made the pain worse.  She states that the pain is somewhat better.  She is experiencing frequency, urgency, nocturia and incontinence. She states that these are baseline symptoms.  RUS performed on 05/17/2016 noted there is borderline caliectasis on the right which persists even after voiding, but no overt hydronephrosis. Moreover, there is a right ureteral jet in the urinary bladder, which certainly argues against high-grade obstruction. Otherwise normal exam.  I have independently reviewed the films.  Her UA noted 0-5 RBC's and 0-5 WBC's.    CT Urogram performed on 06/08/2016 noted no explanation for the patient's history of flank pain or microhematuria. Specifically, no urinary tract calculi no findings of urinary tract obstruction are noted at this time.  No acute findings in the abdomen or pelvis.   Aortic atherosclerosis.  Normal appendix.  I have independently reviewed the films.    Today, she is not experiencing flank pain or gross hematuria.  She is also not experiencing dysuria, suprapubic pain, fevers, chills, nausea or vomiting.  UA today demonstrates TNTC RBC's but she is having her menses.    PMH: Past Medical History:  Diagnosis Date  . Anxiety   . Libido, decreased   .  Sleep pattern disturbance     Surgical History: Past Surgical History:  Procedure Laterality Date  . BREAST BIOPSY Right 2011   CORE W/CLIP - NEG  . CESAREAN SECTION  1997    Home Medications:  Allergies as of 06/15/2016      Reactions   Percocet [oxycodone-acetaminophen]       Medication List       Accurate as of 06/15/16 11:35 AM. Always use your most recent med list.          MULTIVITAL tablet Take 1 tablet by mouth daily.   nitrofurantoin (macrocrystal-monohydrate) 100 MG capsule Commonly known as:  MACROBID Take 1 capsule (100 mg total) by mouth 2 (two) times daily.   triazolam 0.25 MG tablet Commonly known as:  HALCION Take 1 tablet (0.25 mg total) by mouth at bedtime as needed for sleep.   vitamin B-12 1000 MCG tablet Commonly known as:  CYANOCOBALAMIN Take 1,500 mcg by mouth daily.   Vitamin D (Ergocalciferol) 50000 units Caps capsule Commonly known as:  DRISDOL Take 1 capsule (50,000 Units total) by mouth 2 (two) times a week.       Allergies:  Allergies  Allergen Reactions  . Percocet [Oxycodone-Acetaminophen]     Family History: Family History  Problem Relation Age of Onset  . Heart disease Father   . Cancer Maternal Grandmother     ovarian  . Diabetes Maternal Grandmother   . Prostate cancer Maternal Grandfather   . Diabetes Maternal Grandfather   .  Breast cancer Neg Hx   . Kidney disease Neg Hx   . Kidney cancer Neg Hx   . Bladder Cancer Neg Hx     Social History:  reports that she has never smoked. She has never used smokeless tobacco. She reports that she drinks alcohol. She reports that she does not use drugs.  ROS: UROLOGY Frequent Urination?: No Hard to postpone urination?: No Burning/pain with urination?: No Get up at night to urinate?: No Leakage of urine?: No Urine stream starts and stops?: No Trouble starting stream?: No Do you have to strain to urinate?: No Blood in urine?: No Urinary tract infection?: No Sexually  transmitted disease?: No Injury to kidneys or bladder?: No Painful intercourse?: No Weak stream?: No Currently pregnant?: No Vaginal bleeding?: No Last menstrual period?: n  Gastrointestinal Nausea?: No Vomiting?: No Indigestion/heartburn?: No Diarrhea?: No Constipation?: No  Constitutional Fever: No Night sweats?: No Weight loss?: No Fatigue?: No  Skin Skin rash/lesions?: No Itching?: No  Eyes Blurred vision?: No Double vision?: No  Ears/Nose/Throat Sore throat?: No Sinus problems?: No  Hematologic/Lymphatic Swollen glands?: No Easy bruising?: No  Cardiovascular Leg swelling?: No Chest pain?: No  Respiratory Cough?: No Shortness of breath?: No  Endocrine Excessive thirst?: No  Musculoskeletal Back pain?: Yes Joint pain?: No  Neurological Headaches?: No Dizziness?: No  Psychologic Depression?: No Anxiety?: No  Physical Exam: BP (!) 146/83   Pulse 61   Ht 5\' 3"  (1.6 m)   Wt 150 lb (68 kg)   LMP 06/14/2016   BMI 26.57 kg/m   Constitutional: Well nourished. Alert and oriented, No acute distress. HEENT: [place] AT, moist mucus membranes. Trachea midline, no masses. Cardiovascular: No clubbing, cyanosis, or edema. Respiratory: Normal respiratory effort, no increased work of breathing. GI: Abdomen is soft, non tender, non distended, no abdominal masses. Liver and spleen not palpable.  No hernias appreciated.  Stool sample for occult testing is not indicated.   GU: No CVA tenderness.  No bladder fullness or masses.   Skin: No rashes, bruises or suspicious lesions. Lymph: No cervical or inguinal adenopathy. Neurologic: Grossly intact, no focal deficits, moving all 4 extremities. Psychiatric: Normal mood and affect.  Laboratory Data:  Lab Results  Component Value Date   CREATININE 0.81 06/08/2016       Component Value Date/Time   CHOL 172 06/08/2016 0843   HDL 59 06/08/2016 0843   CHOLHDL 2.9 06/08/2016 0843   LDLCALC 103 (H) 06/08/2016  0843    Lab Results  Component Value Date   AST 14 06/08/2016   Lab Results  Component Value Date   ALT 27 06/08/2016    Urinalysis TNTC RBC's.  See EPIC.    Pertinent Imaging: CLINICAL DATA:  46 year old female with history of right-sided flank pain for the past 2 weeks and microhematuria.  EXAM: CT ABDOMEN AND PELVIS WITHOUT AND WITH CONTRAST  TECHNIQUE: Multidetector CT imaging of the abdomen and pelvis was performed following the standard protocol before and following the bolus administration of intravenous contrast.  CONTRAST:  121mL ISOVUE-300 IOPAMIDOL (ISOVUE-300) INJECTION 61%  COMPARISON:  No priors.  FINDINGS: Lower chest: Unremarkable.  Hepatobiliary: No cystic or solid hepatic lesions. No intra or extrahepatic biliary ductal dilatation. Gallbladder is normal in appearance.  Pancreas: No pancreatic mass. No pancreatic ductal dilatation. No pancreatic or peripancreatic fluid or inflammatory changes.  Spleen: Unremarkable.  Adrenals/Urinary Tract: Precontrast images demonstrate no calcifications within the collecting system of either kidney, along the course of either ureter, or within the  lumen of the urinary bladder. Post contrast images demonstrate no focal solid renal lesions. Post contrast delayed images demonstrate no definite filling defects within the collecting system of either kidney, along the course of either ureter, or within the lumen of the urinary bladder to strongly suggest the presence of a urothelial neoplasm. Urinary bladder is normal in appearance. Bilateral adrenal glands are normal in appearance.  Stomach/Bowel: The stomach is normal in appearance. No pathologic dilatation of small bowel or colon. Normal appendix.  Vascular/Lymphatic: Aortic atherosclerosis, without evidence of aneurysm or dissection in the abdominal or pelvic vasculature. No lymphadenopathy noted in the abdomen or pelvis.  Reproductive: Uterus  and ovaries are unremarkable in appearance.  Other: Trace volume of free fluid in the cul-de-sac, presumably physiologic in this young female patient. No significant volume of ascites. No pneumoperitoneum.  Musculoskeletal: There are no aggressive appearing lytic or blastic lesions noted in the visualized portions of the skeleton.  IMPRESSION: 1. No explanation for the patient's history of flank pain or microhematuria. Specifically, no urinary tract calculi no findings of urinary tract obstruction are noted at this time. 2. No acute findings in the abdomen or pelvis. 3. Aortic atherosclerosis. 4. Normal appendix.   Electronically Signed   By: Vinnie Langton M.D.   On: 06/08/2016 13:48  Assessment & Plan:    1. Microscopic hematuria  - no worrisome findings on CT Urogram  - UA is still with RBC's but patient is on menses  - offered cystoscopy at this time, but patient deferred - will recheck UA in 2 weeks  - advised patient of 2% to 5% risk with microscopic hematuria of missing a bladder cancer if she did not have the cystoscopy - she understands the risks  - will have patient RTC in 2 weeks to check UA only  2. Right flank pain  - resolved  - ? Passage of stone  - CT Urogram negative for stone and hydronephrosis   Return in about 2 weeks (around 06/29/2016) for UA only.  These notes generated with voice recognition software. I apologize for typographical errors.  Zara Council, [facility] [address] [place], [place] [address] [phone]

## 2016-06-16 LAB — URINE CULTURE

## 2016-06-20 ENCOUNTER — Other Ambulatory Visit: Payer: Self-pay | Admitting: Obstetrics and Gynecology

## 2016-06-20 MED ORDER — AMPHETAMINE-DEXTROAMPHETAMINE 15 MG PO TABS: 15.0000 mg | ORAL_TABLET | Freq: Two times a day (BID) | ORAL | 0 refills | Status: DC

## 2016-06-20 NOTE — Telephone Encounter (Signed)
Pt called in regards to email she sent Friday.  Please call pt back at [phone]  [facility]

## 2016-07-06 ENCOUNTER — Ambulatory Visit: Payer: 59

## 2016-07-10 ENCOUNTER — Other Ambulatory Visit: Payer: Self-pay | Admitting: Obstetrics and Gynecology

## 2016-07-13 ENCOUNTER — Ambulatory Visit
Admission: RE | Admit: 2016-07-13 | Discharge: 2016-07-13 | Disposition: A | Discharge status: Home / Self Care | Payer: 59 | Source: Ambulatory Visit | Attending: Obstetrics and Gynecology | Admitting: Obstetrics and Gynecology

## 2016-07-13 DIAGNOSIS — Z1231 Encounter for screening mammogram for malignant neoplasm of breast: Secondary | ICD-10-CM | POA: Diagnosis not present

## 2016-07-20 ENCOUNTER — Other Ambulatory Visit: Payer: Self-pay | Admitting: Obstetrics and Gynecology

## 2016-07-21 ENCOUNTER — Other Ambulatory Visit: Payer: Self-pay | Admitting: Obstetrics and Gynecology

## 2016-07-24 ENCOUNTER — Other Ambulatory Visit: Payer: Self-pay | Admitting: *Deleted

## 2016-07-24 ENCOUNTER — Encounter: Payer: Self-pay | Admitting: Obstetrics and Gynecology

## 2016-07-24 MED ORDER — AMPHETAMINE-DEXTROAMPHETAMINE 15 MG PO TABS: 15.0000 mg | ORAL_TABLET | Freq: Two times a day (BID) | ORAL | 0 refills | Status: DC

## 2016-07-24 MED ORDER — TRIAZOLAM 0.25 MG PO TABS: 0.2500 mg | ORAL_TABLET | Freq: Every evening | ORAL | 2 refills | Status: DC | PRN

## 2016-08-30 ENCOUNTER — Other Ambulatory Visit: Payer: Self-pay | Admitting: Obstetrics and Gynecology

## 2016-09-03 ENCOUNTER — Encounter: Payer: Self-pay | Admitting: Obstetrics and Gynecology

## 2016-09-03 ENCOUNTER — Other Ambulatory Visit: Payer: Self-pay | Admitting: Obstetrics and Gynecology

## 2016-09-04 ENCOUNTER — Other Ambulatory Visit: Payer: Self-pay | Admitting: *Deleted

## 2016-09-04 MED ORDER — AMPHETAMINE-DEXTROAMPHETAMINE 15 MG PO TABS: 15.0000 mg | ORAL_TABLET | Freq: Two times a day (BID) | ORAL | 0 refills | Status: DC

## 2016-09-04 MED ORDER — TRIAZOLAM 0.25 MG PO TABS: 0.2500 mg | ORAL_TABLET | Freq: Every evening | ORAL | 2 refills | Status: DC | PRN

## 2016-11-20 ENCOUNTER — Encounter: Payer: Self-pay | Admitting: Obstetrics and Gynecology

## 2016-11-21 ENCOUNTER — Other Ambulatory Visit: Payer: Self-pay | Admitting: *Deleted

## 2016-11-21 MED ORDER — TRIAZOLAM 0.25 MG PO TABS: 0.2500 mg | ORAL_TABLET | Freq: Every evening | ORAL | 2 refills | Status: DC | PRN

## 2016-12-28 ENCOUNTER — Other Ambulatory Visit: Payer: Self-pay | Admitting: Obstetrics and Gynecology

## 2016-12-30 ENCOUNTER — Other Ambulatory Visit: Payer: Self-pay | Admitting: Obstetrics and Gynecology

## 2016-12-31 ENCOUNTER — Encounter: Payer: Self-pay | Admitting: Obstetrics and Gynecology

## 2016-12-31 ENCOUNTER — Other Ambulatory Visit: Payer: Self-pay | Admitting: *Deleted

## 2016-12-31 MED ORDER — AMPHETAMINE-DEXTROAMPHETAMINE 15 MG PO TABS: 15.0000 mg | ORAL_TABLET | Freq: Two times a day (BID) | ORAL | 0 refills | Status: DC

## 2017-03-05 ENCOUNTER — Encounter: Payer: Self-pay | Admitting: Obstetrics and Gynecology

## 2017-03-05 ENCOUNTER — Other Ambulatory Visit: Payer: Self-pay | Admitting: *Deleted

## 2017-03-05 MED ORDER — AMPHETAMINE-DEXTROAMPHETAMINE 15 MG PO TABS: 15.0000 mg | ORAL_TABLET | Freq: Two times a day (BID) | ORAL | 0 refills | Status: DC

## 2017-03-12 ENCOUNTER — Other Ambulatory Visit: Payer: Self-pay | Admitting: *Deleted

## 2017-03-24 ENCOUNTER — Other Ambulatory Visit: Payer: Self-pay | Admitting: Obstetrics and Gynecology

## 2017-03-26 ENCOUNTER — Encounter: Payer: Self-pay | Admitting: Obstetrics and Gynecology

## 2017-03-26 ENCOUNTER — Other Ambulatory Visit: Payer: Self-pay | Admitting: Obstetrics and Gynecology

## 2017-05-15 ENCOUNTER — Encounter: Payer: Self-pay | Admitting: Obstetrics and Gynecology

## 2017-05-16 ENCOUNTER — Telehealth: Payer: Self-pay | Admitting: Obstetrics and Gynecology

## 2017-05-16 NOTE — Telephone Encounter (Signed)
I called the patient to reschedule her apt to a later date due to her provider being out of the office, The [patient would like to have Jennifer Rowland give her a call to discuss what can be done in regards to her two prescriptions that will run out before she is able to see Melody. Please advise.

## 2017-05-16 NOTE — Telephone Encounter (Signed)
Spoke with pt

## 2017-06-18 ENCOUNTER — Encounter: Payer: 59 | Admitting: Obstetrics and Gynecology

## 2017-06-21 ENCOUNTER — Other Ambulatory Visit: Payer: Self-pay | Admitting: Obstetrics and Gynecology

## 2017-06-21 DIAGNOSIS — Z1239 Encounter for other screening for malignant neoplasm of breast: Secondary | ICD-10-CM

## 2017-07-08 ENCOUNTER — Encounter: Payer: Self-pay | Admitting: Obstetrics and Gynecology

## 2017-07-09 ENCOUNTER — Encounter: Payer: Self-pay | Admitting: *Deleted

## 2017-07-19 ENCOUNTER — Other Ambulatory Visit: Payer: Self-pay | Admitting: Obstetrics and Gynecology

## 2017-07-19 ENCOUNTER — Encounter: Payer: Self-pay | Admitting: Obstetrics and Gynecology

## 2017-07-19 ENCOUNTER — Ambulatory Visit
Admission: RE | Admit: 2017-07-19 | Discharge: 2017-07-19 | Disposition: A | Discharge status: Home / Self Care | Payer: 59 | Source: Ambulatory Visit | Attending: Obstetrics and Gynecology | Admitting: Obstetrics and Gynecology

## 2017-07-19 ENCOUNTER — Ambulatory Visit (INDEPENDENT_AMBULATORY_CARE_PROVIDER_SITE_OTHER): Payer: 59 | Admitting: Obstetrics and Gynecology

## 2017-07-19 ENCOUNTER — Other Ambulatory Visit: Payer: 59

## 2017-07-19 VITALS — Ht 64.5 in | Wt 114.9 lb

## 2017-07-19 DIAGNOSIS — Z0000 Encounter for general adult medical examination without abnormal findings: Secondary | ICD-10-CM

## 2017-07-19 DIAGNOSIS — R209 Unspecified disturbances of skin sensation: Secondary | ICD-10-CM

## 2017-07-19 DIAGNOSIS — Z1231 Encounter for screening mammogram for malignant neoplasm of breast: Secondary | ICD-10-CM | POA: Diagnosis not present

## 2017-07-19 DIAGNOSIS — Z1239 Encounter for other screening for malignant neoplasm of breast: Secondary | ICD-10-CM

## 2017-07-19 DIAGNOSIS — Z01419 Encounter for gynecological examination (general) (routine) without abnormal findings: Secondary | ICD-10-CM | POA: Diagnosis not present

## 2017-07-19 DIAGNOSIS — I7300 Raynaud's syndrome without gangrene: Secondary | ICD-10-CM | POA: Diagnosis not present

## 2017-07-19 MED ORDER — TRIAZOLAM 0.25 MG PO TABS: ORAL_TABLET | ORAL | 2 refills | Status: DC

## 2017-07-19 MED ORDER — AMPHETAMINE-DEXTROAMPHETAMINE 15 MG PO TABS: 15.0000 mg | ORAL_TABLET | Freq: Two times a day (BID) | ORAL | 0 refills | Status: DC

## 2017-07-19 NOTE — Progress Notes (Signed)
Subjective:   Jennifer Rowland is a 47 y.o. G49P2 Caucasian female here for a routine well-woman exam.  Patient's last menstrual period was 07/13/2017.    Current complaints: burning and tingling in feet at night, and toes stay purple in color. No swelling, Fingers will do the same. No alleviating factors. No heat/cold intolerance. No family history of Raynuads or neuropathy. Has been for last couple months.  PCP: me       does desire labs  Social History: Sexual: heterosexual Marital Status: married Living situation: with family Occupation: Research scientist (physical sciences) at [employer]. Tobacco/alcohol: no tobacco use Illicit drugs: no history of illicit drug use  The following portions of the patient's history were reviewed and updated as appropriate: allergies, current medications, past family history, past medical history, past social history, past surgical history and problem list.  Past Medical History Past Medical History:  Diagnosis Date  . Anxiety   . Libido, decreased   . Sleep pattern disturbance     Past Surgical History Past Surgical History:  Procedure Laterality Date  . BREAST BIOPSY Right 2011   CORE W/CLIP - NEG  . CESAREAN SECTION  1997    Gynecologic History G2P2  Patient's last menstrual period was 07/13/2017. Contraception: vasectomy Last Pap: 2016. Results were: normal Last mammogram: 03/18. Results were: normal   Obstetric History OB History  Gravida Para Term Preterm AB Living  2 2          SAB TAB Ectopic Multiple Live Births               # Outcome Date GA Lbr Len/2nd Weight Sex Delivery Anes PTL Lv  2 Para 1997    F CS-Unspec     1 Para 1994    M Vag-Spont       Current Medications Current Outpatient Medications on File Prior to Visit  Medication Sig Dispense Refill  . Multiple Vitamins-Minerals (MULTIVITAL) tablet Take 1 tablet by mouth daily.    . nitrofurantoin, macrocrystal-monohydrate, (MACROBID) 100 MG capsule Take 1 capsule (100 mg total) by mouth 2  (two) times daily. (Patient not taking: Reported on 06/01/2016) 14 capsule 2  . vitamin B-12 (CYANOCOBALAMIN) 1000 MCG tablet Take 1,500 mcg by mouth daily.     . Vitamin D, Ergocalciferol, (DRISDOL) 50000 units CAPS capsule Take 1 capsule (50,000 Units total) by mouth 2 (two) times a week. (Patient not taking: Reported on 06/15/2016) 30 capsule 2   No current facility-administered medications on file prior to visit.     Review of Systems Patient denies any headaches, blurred vision, shortness of breath, chest pain, abdominal pain, problems with bowel movements, urination, or intercourse.  Objective:  Ht 5' 4.5" (1.638 m)   Wt 114 lb 14.4 oz (52.1 kg)   LMP 07/13/2017   BMI 19.42 kg/m  Physical Exam  General:  Well developed, well nourished, no acute distress. She is alert and oriented x3. Skin:  Warm and dry Neck:  Midline trachea, no thyromegaly or nodules Cardiovascular: Regular rate and rhythm, no murmur heard Lungs:  Effort normal, all lung fields clear to auscultation bilaterally Breasts:  No dominant palpable mass, retraction, or nipple discharge Abdomen:  Soft, non tender, no hepatosplenomegaly or masses Pelvic:  External genitalia is normal in appearance.  The vagina is normal in appearance. The cervix is bulbous, no CMT.  Thin prep pap is done with HR HPV cotesting. Uterus is felt to be normal size, shape, and contour.  No adnexal masses or tenderness noted.  Extremities:  No swelling or varicosities noted Psych:  She has a normal mood and affect  Assessment:   Healthy well-woman exam ADD Purple and cold feet    Plan:  Labs obtained- will follow up accordingly Will refer toRheumatology F/U 1 year for AE, or sooner if needed Mammogram done today     Arneda [place]ton Rockney Ghee, CNM

## 2017-07-20 LAB — COMPREHENSIVE METABOLIC PANEL
ALBUMIN: 4.6 g/dL (ref 3.5–5.5)
ALK PHOS: 62 IU/L (ref 39–117)
ALT: 11 IU/L (ref 0–32)
AST: 11 IU/L (ref 0–40)
Albumin/Globulin Ratio: 2.1 (ref 1.2–2.2)
BILIRUBIN TOTAL: 0.6 mg/dL (ref 0.0–1.2)
BUN / CREAT RATIO: 16 (ref 9–23)
BUN: 13 mg/dL (ref 6–24)
CHLORIDE: 102 mmol/L (ref 96–106)
CO2: 25 mmol/L (ref 20–29)
Calcium: 9.3 mg/dL (ref 8.7–10.2)
Creatinine, Ser: 0.83 mg/dL (ref 0.57–1.00)
GFR calc Af Amer: 98 mL/min/{1.73_m2} (ref >59)
GFR calc non Af Amer: 85 mL/min/{1.73_m2} (ref >59)
GLUCOSE: 90 mg/dL (ref 65–99)
Globulin, Total: 2.2 g/dL (ref 1.5–4.5)
Potassium: 4.6 mmol/L (ref 3.5–5.2)
Sodium: 141 mmol/L (ref 134–144)
Total Protein: 6.8 g/dL (ref 6.0–8.5)

## 2017-07-20 LAB — B12 AND FOLATE PANEL
Folate: >20 ng/mL (ref >3.0)
Vitamin B-12: 600 pg/mL (ref 232–1245)

## 2017-07-20 LAB — VITAMIN D 25 HYDROXY (VIT D DEFICIENCY, FRACTURES): VIT D 25 HYDROXY: 37.2 ng/mL (ref 30.0–100.0)

## 2017-07-20 LAB — LIPID PANEL
CHOLESTEROL TOTAL: 174 mg/dL (ref 100–199)
Chol/HDL Ratio: 2.7 ratio (ref 0.0–4.4)
HDL: 65 mg/dL (ref >39)
LDL Calculated: 98 mg/dL (ref 0–99)
Triglycerides: 54 mg/dL (ref 0–149)
VLDL CHOLESTEROL CAL: 11 mg/dL (ref 5–40)

## 2017-07-20 LAB — HEMOGLOBIN A1C
ESTIMATED AVERAGE GLUCOSE: 100 mg/dL
HEMOGLOBIN A1C: 5.1 % (ref 4.8–5.6)

## 2017-07-20 LAB — TSH: TSH: 1.54 u[IU]/mL (ref 0.450–4.500)

## 2017-07-23 ENCOUNTER — Encounter: Payer: Self-pay | Admitting: Obstetrics and Gynecology

## 2017-07-23 LAB — CYTOLOGY - PAP

## 2017-07-26 ENCOUNTER — Encounter: Payer: Self-pay | Admitting: Obstetrics and Gynecology

## 2017-08-06 ENCOUNTER — Encounter: Payer: Self-pay | Admitting: Obstetrics and Gynecology

## 2017-08-12 LAB — SEDIMENTATION RATE: SED RATE: 2 mm/h (ref 0–32)

## 2017-08-12 LAB — ANA: Anti Nuclear Antibody(ANA): NEGATIVE

## 2017-08-12 LAB — SPECIMEN STATUS REPORT

## 2017-08-23 DIAGNOSIS — I7300 Raynaud's syndrome without gangrene: Secondary | ICD-10-CM | POA: Insufficient documentation

## 2017-08-23 DIAGNOSIS — I7389 Other specified peripheral vascular diseases: Secondary | ICD-10-CM | POA: Diagnosis not present

## 2017-08-30 DIAGNOSIS — I7300 Raynaud's syndrome without gangrene: Secondary | ICD-10-CM | POA: Diagnosis not present

## 2017-09-16 ENCOUNTER — Other Ambulatory Visit: Payer: Self-pay

## 2017-09-16 ENCOUNTER — Other Ambulatory Visit: Payer: 59

## 2017-09-16 ENCOUNTER — Encounter: Payer: Self-pay | Admitting: Obstetrics and Gynecology

## 2017-09-16 DIAGNOSIS — R300 Dysuria: Secondary | ICD-10-CM | POA: Diagnosis not present

## 2017-09-19 ENCOUNTER — Other Ambulatory Visit: Payer: Self-pay | Admitting: *Deleted

## 2017-09-19 LAB — URINE CULTURE

## 2017-09-19 MED ORDER — NITROFURANTOIN MONOHYD MACRO 100 MG PO CAPS: 100.0000 mg | ORAL_CAPSULE | Freq: Two times a day (BID) | ORAL | 2 refills | Status: DC

## 2017-10-10 ENCOUNTER — Other Ambulatory Visit: Payer: Self-pay | Admitting: *Deleted

## 2017-10-10 ENCOUNTER — Encounter: Payer: Self-pay | Admitting: Obstetrics and Gynecology

## 2017-10-10 MED ORDER — TRIAZOLAM 0.25 MG PO TABS: ORAL_TABLET | ORAL | 2 refills | Status: DC

## 2017-10-30 ENCOUNTER — Encounter: Payer: Self-pay | Admitting: Obstetrics and Gynecology

## 2017-11-27 ENCOUNTER — Other Ambulatory Visit: Payer: Self-pay | Admitting: *Deleted

## 2017-11-27 MED ORDER — TRIAZOLAM 0.25 MG PO TABS: ORAL_TABLET | ORAL | 3 refills | Status: DC

## 2017-12-09 ENCOUNTER — Other Ambulatory Visit: Payer: Self-pay | Admitting: *Deleted

## 2017-12-09 MED ORDER — AMPHETAMINE-DEXTROAMPHETAMINE 15 MG PO TABS: 15.0000 mg | ORAL_TABLET | Freq: Two times a day (BID) | ORAL | 0 refills | Status: DC

## 2017-12-10 ENCOUNTER — Encounter: Payer: Self-pay | Admitting: *Deleted

## 2018-02-21 ENCOUNTER — Other Ambulatory Visit: Payer: Self-pay | Admitting: *Deleted

## 2018-02-21 MED ORDER — TRIAZOLAM 0.25 MG PO TABS: ORAL_TABLET | ORAL | 3 refills | Status: DC

## 2018-03-06 IMAGING — US US RENAL
1 series · 14 of 25 positions shown · non-contrast
Comparison: None.

CLINICAL DATA: Right flank pain for 2 days.

EXAM:
RENAL / URINARY TRACT ULTRASOUND COMPLETE

[Series 1: us renal · 0.25mm/px · 14 of 54 slices shown]
[im 1/54]
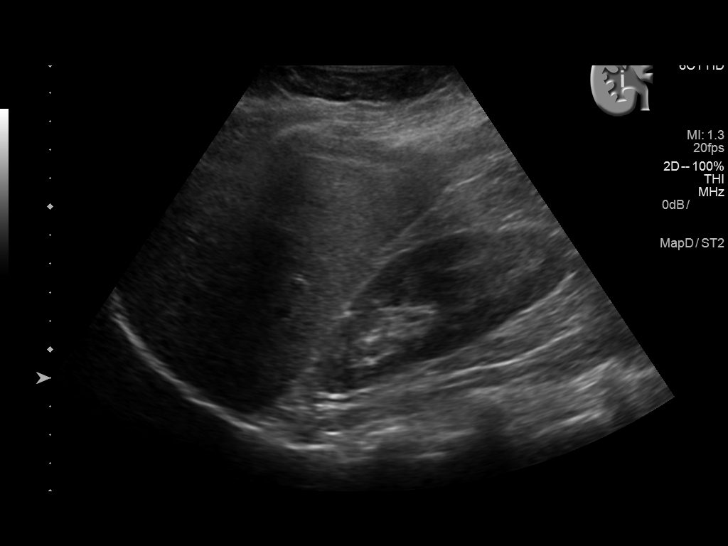
[im 5/54]
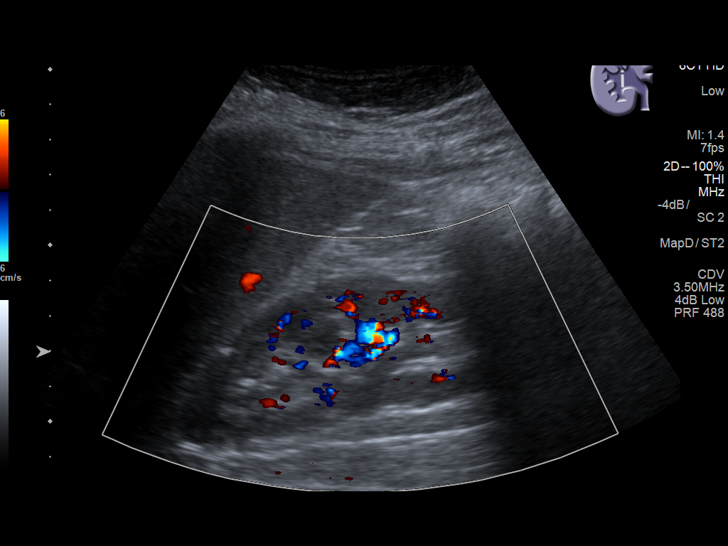
[im 9/54]
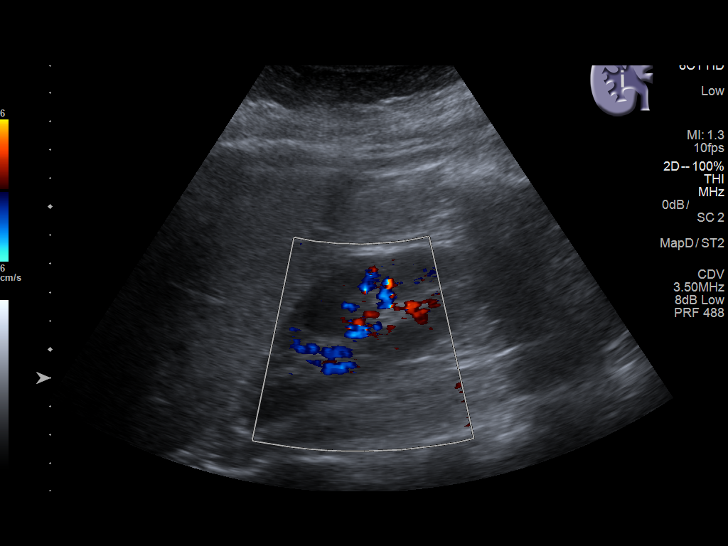
[im 14/54]
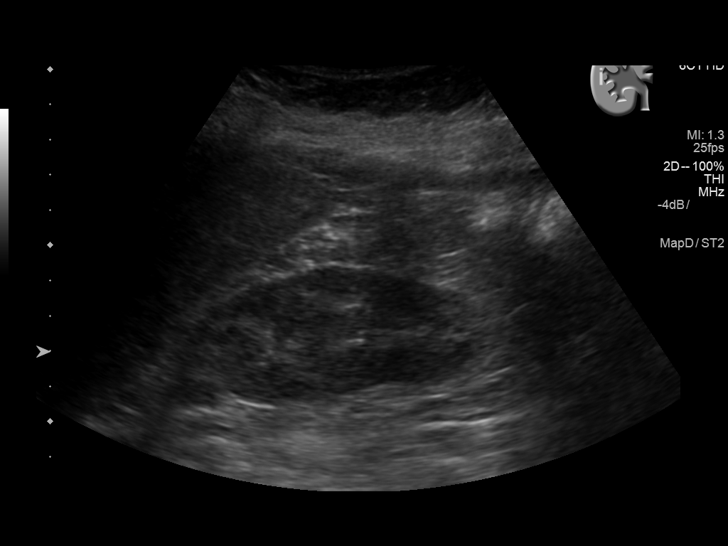
[im 18/54]
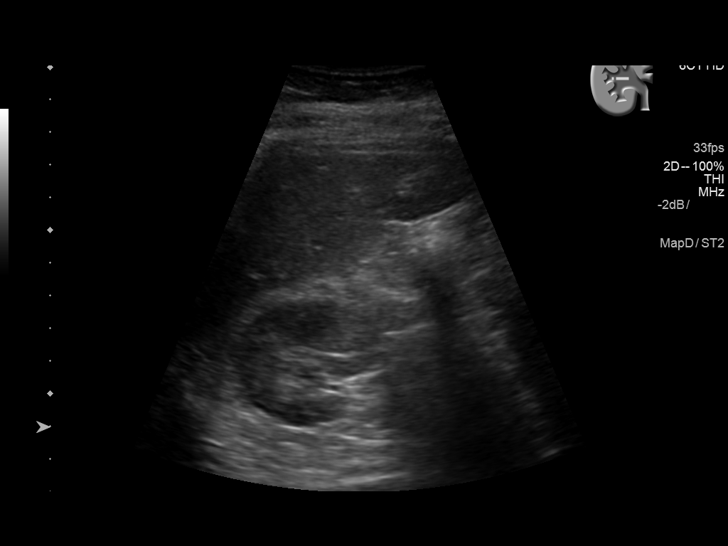
[im 20/54]
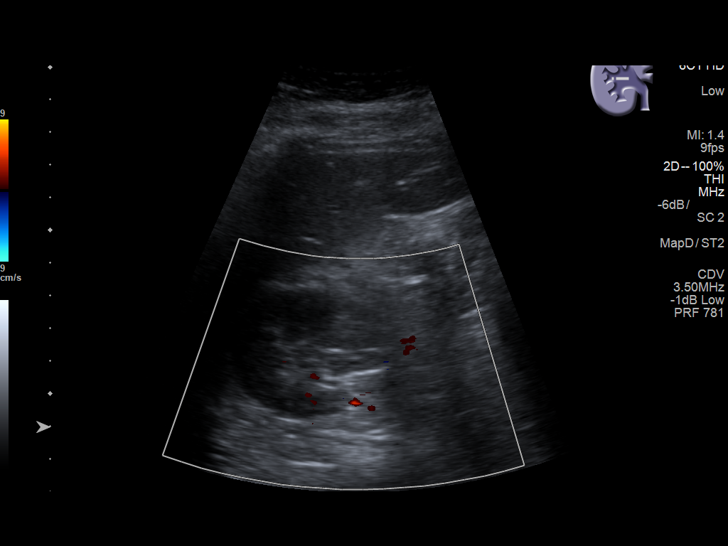
[im 25/54]
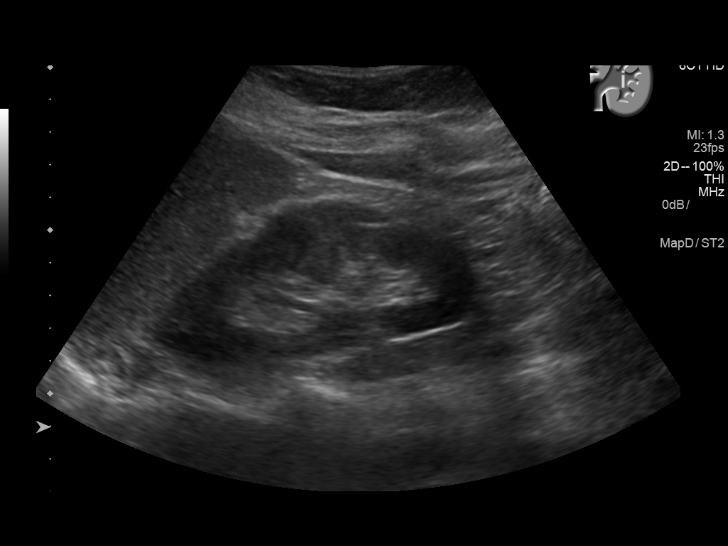
[im 29/54]
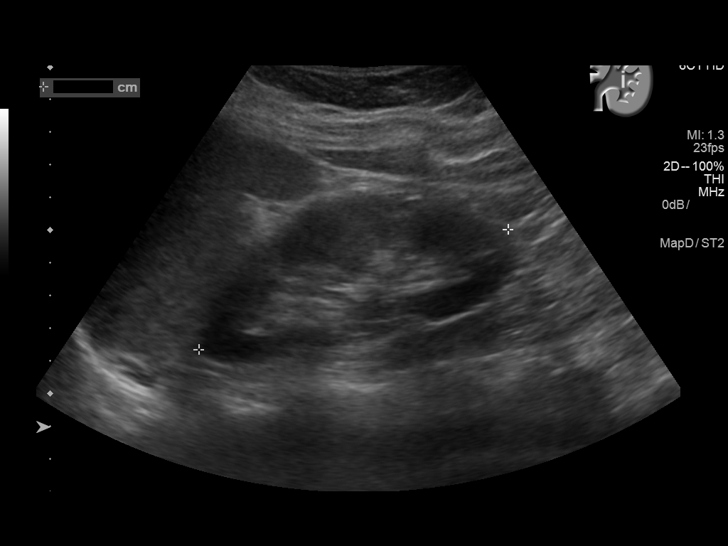
[im 34/54]
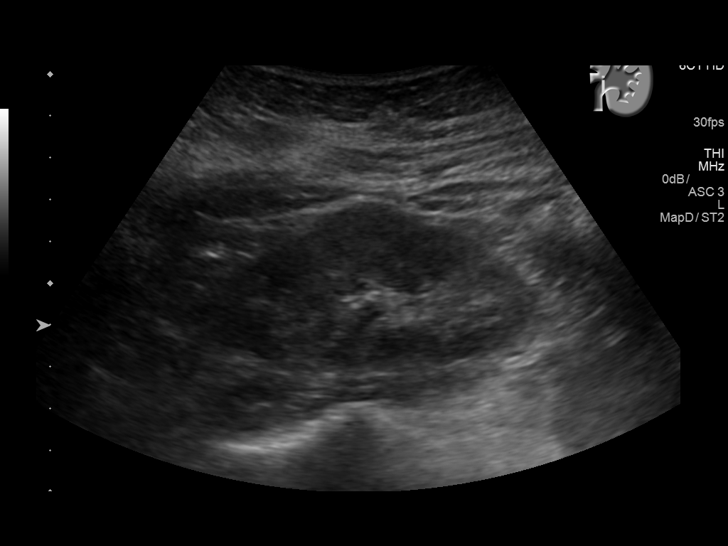
[im 36/54]
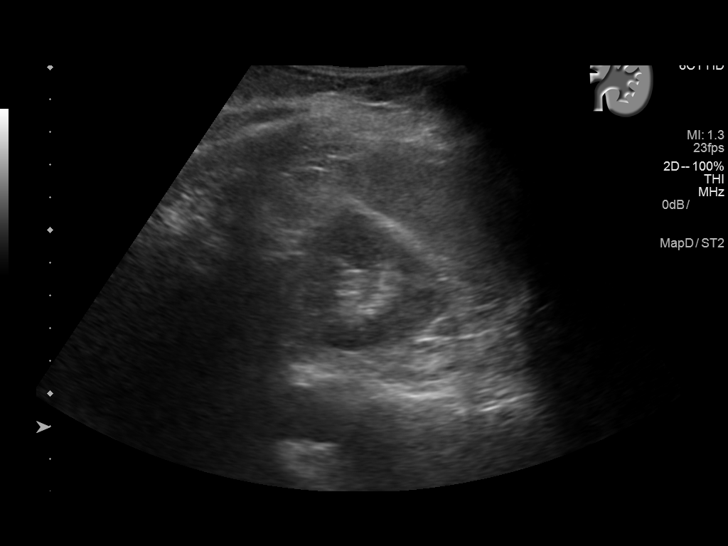
[im 40/54]
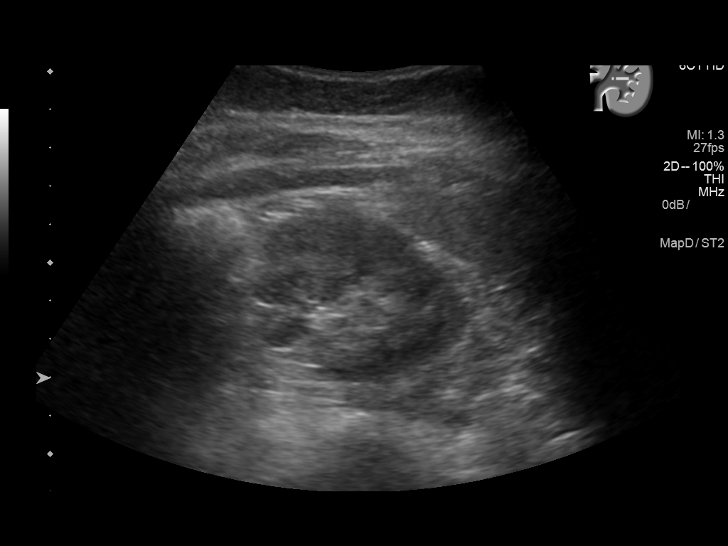
[im 45/54]
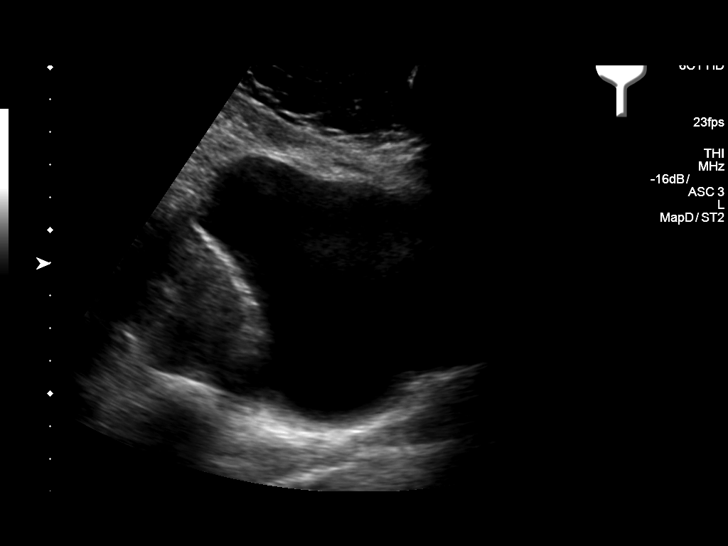
[im 49/54]
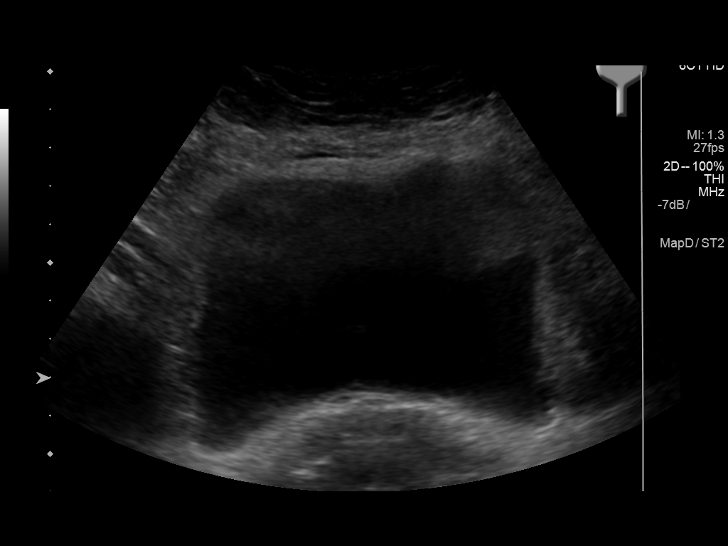
[im 54/54]
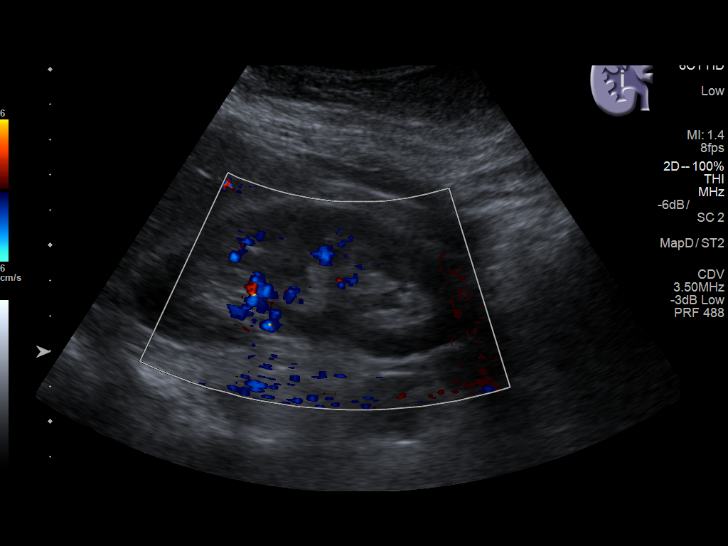

[14 of 25 positions shown; findings below may reference images not displayed]

FINDINGS: Right Kidney:

Length: 11.2 cm. Echogenicity within normal limits. No mass
observed. There is borderline caliectasis but no overt
hydronephrosis. The borderline caliectasis persists even after
voiding.

Left Kidney:

Length: 10.5 cm. Echogenicity within normal limits. No mass or
hydronephrosis visualized.

Bladder:

Appears normal for degree of bladder distention.
IMPRESSION: 1. There is borderline caliectasis on the right which persists even
after voiding, but no overt hydronephrosis. Moreover, there is a
right ureteral jet in the urinary bladder, which certainly argues
against high-grade obstruction. Otherwise normal exam.

## 2018-03-31 ENCOUNTER — Other Ambulatory Visit: Payer: Self-pay | Admitting: *Deleted

## 2018-03-31 MED ORDER — AMPHETAMINE-DEXTROAMPHETAMINE 15 MG PO TABS: 15.0000 mg | ORAL_TABLET | Freq: Two times a day (BID) | ORAL | 0 refills | Status: DC

## 2018-06-04 DIAGNOSIS — Z0000 Encounter for general adult medical examination without abnormal findings: Secondary | ICD-10-CM | POA: Diagnosis not present

## 2018-06-30 ENCOUNTER — Other Ambulatory Visit: Payer: Self-pay | Admitting: *Deleted

## 2018-06-30 MED ORDER — TRIAZOLAM 0.25 MG PO TABS: ORAL_TABLET | ORAL | 3 refills | Status: DC

## 2018-07-15 ENCOUNTER — Other Ambulatory Visit: Payer: Self-pay

## 2018-07-16 ENCOUNTER — Other Ambulatory Visit: Payer: Self-pay | Admitting: Obstetrics and Gynecology

## 2018-07-16 ENCOUNTER — Other Ambulatory Visit: Payer: Self-pay

## 2018-07-16 DIAGNOSIS — Z1231 Encounter for screening mammogram for malignant neoplasm of breast: Secondary | ICD-10-CM

## 2018-07-17 ENCOUNTER — Other Ambulatory Visit: Payer: Self-pay

## 2018-07-17 ENCOUNTER — Other Ambulatory Visit: Payer: Self-pay | Admitting: *Deleted

## 2018-07-17 MED ORDER — AMPHETAMINE-DEXTROAMPHETAMINE 15 MG PO TABS: 15.0000 mg | ORAL_TABLET | Freq: Two times a day (BID) | ORAL | 0 refills | Status: DC

## 2018-07-17 NOTE — Telephone Encounter (Signed)
wanna try and see if it will go electronic??

## 2018-07-25 ENCOUNTER — Encounter: Payer: 59 | Admitting: Obstetrics and Gynecology

## 2018-07-29 ENCOUNTER — Other Ambulatory Visit: Payer: Self-pay | Admitting: Obstetrics and Gynecology

## 2018-07-29 MED ORDER — AMPHETAMINE-DEXTROAMPHETAMINE 15 MG PO TABS: 15.0000 mg | ORAL_TABLET | Freq: Two times a day (BID) | ORAL | 0 refills | Status: DC

## 2018-07-31 ENCOUNTER — Encounter: Payer: 59 | Admitting: Obstetrics and Gynecology

## 2018-08-01 ENCOUNTER — Encounter: Payer: 59 | Admitting: Obstetrics and Gynecology

## 2018-09-17 ENCOUNTER — Ambulatory Visit
Admission: RE | Admit: 2018-09-17 | Discharge: 2018-09-17 | Disposition: A | Discharge status: Home / Self Care | Payer: 59 | Source: Ambulatory Visit | Attending: Obstetrics and Gynecology | Admitting: Obstetrics and Gynecology

## 2018-09-17 ENCOUNTER — Other Ambulatory Visit: Payer: Self-pay

## 2018-09-17 DIAGNOSIS — Z1231 Encounter for screening mammogram for malignant neoplasm of breast: Secondary | ICD-10-CM | POA: Diagnosis present

## 2018-10-24 ENCOUNTER — Encounter: Payer: Self-pay | Admitting: Obstetrics and Gynecology

## 2018-10-24 ENCOUNTER — Other Ambulatory Visit: Payer: Self-pay

## 2018-10-24 ENCOUNTER — Ambulatory Visit (INDEPENDENT_AMBULATORY_CARE_PROVIDER_SITE_OTHER): Payer: 59 | Admitting: Obstetrics and Gynecology

## 2018-10-24 VITALS — BP 149/73 | HR 68 | Ht 63.0 in | Wt 115.0 lb

## 2018-10-24 DIAGNOSIS — E559 Vitamin D deficiency, unspecified: Secondary | ICD-10-CM | POA: Diagnosis not present

## 2018-10-24 DIAGNOSIS — Z01419 Encounter for gynecological examination (general) (routine) without abnormal findings: Secondary | ICD-10-CM | POA: Diagnosis not present

## 2018-10-24 DIAGNOSIS — N921 Excessive and frequent menstruation with irregular cycle: Secondary | ICD-10-CM

## 2018-10-24 MED ORDER — TRIAZOLAM 0.25 MG PO TABS: ORAL_TABLET | ORAL | 4 refills | Status: DC

## 2018-10-24 MED ORDER — AMPHETAMINE-DEXTROAMPHETAMINE 15 MG PO TABS: 15.0000 mg | ORAL_TABLET | Freq: Two times a day (BID) | ORAL | 0 refills | Status: DC

## 2018-10-24 MED ORDER — LEVONORGEST-ETH ESTRAD 91-DAY 0.1-0.02 & 0.01 MG PO TABS: 1.0000 | ORAL_TABLET | Freq: Every day | ORAL | 4 refills | Status: DC

## 2018-10-24 NOTE — Progress Notes (Signed)
Subjective:   Jennifer Rowland is a 48 y.o. G24P2002 Caucasian female here for a routine well-woman exam.  Patient's last menstrual period was 09/29/2018.    Current complaints: increased stressors at work with pandemic, menses are becoming more irregular and heavier with cramping- is open to regulating with OCPs PCP: Sabra Heck       PCP does labs  Social History: Sexual: heterosexual Marital Status: married Living situation: with spouse Occupation: unknown occupation Tobacco/alcohol: no tobacco use Illicit drugs: no history of illicit drug use  The following portions of the patient's history were reviewed and updated as appropriate: allergies, current medications, past family history, past medical history, past social history, past surgical history and problem list.  Past Medical History Past Medical History:  Diagnosis Date  . Anxiety   . Libido, decreased   . Sleep pattern disturbance     Past Surgical History Past Surgical History:  Procedure Laterality Date  . BREAST BIOPSY Right 2011   CORE W/CLIP - NEG  . [place]ars    Gynecologic History G2P2002  Patient's last menstrual period was 09/29/2018. Contraception: vasectomy Last Pap: 2019. Results were: normal Last mammogram: 09/2018. Results were: normal   Obstetric History OB History  Gravida Para Term Preterm AB Living  2 2 2     2   SAB TAB Ectopic Multiple Live Births          2    # Outcome Date GA Lbr Len/2nd Weight Sex Delivery Anes PTL Lv  2 Term 1997   7 lb 1.8 oz (3.225 kg) F CS-Unspec   LIV  1 Term 1994   7 lb 1.8 oz (3.225 kg) M Vag-Spont   LIV    Current Medications Current Outpatient Medications on File Prior to Visit  Medication Sig Dispense Refill  . amphetamine-dextroamphetamine (ADDERALL) 15 MG tablet Take 1 tablet by mouth 2 (two) times daily. 62 tablet 0  . triazolam (HALCION) 0.25 MG tablet TAKE 1 TABLET BY MOUTH AT BEDTIME AS NEEDED SLEEP 30 tablet 3   No current  facility-administered medications on file prior to visit.     Review of Systems Patient denies any headaches, blurred vision, shortness of breath, chest pain, abdominal pain, problems with bowel movements, urination, or intercourse.  Objective:  BP (!) 149/73   Pulse 68   Ht 5\' 3"  (1.6 m)   Wt 115 lb (52.2 kg)   LMP 09/29/2018   BMI 20.37 kg/m  Physical Exam  General:  Well developed, well nourished, no acute distress. She is alert and oriented x3. Skin:  Warm and dry Neck:  Midline trachea, no thyromegaly or nodules Cardiovascular: Regular rate and rhythm, no murmur heard Lungs:  Effort normal, all lung fields clear to auscultation bilaterally Breasts:  No dominant palpable mass, retraction, or nipple discharge Abdomen:  Soft, non tender, no hepatosplenomegaly or masses Pelvic:  External genitalia is normal in appearance.  The vagina is normal in appearance. The cervix is bulbous, no CMT.  Thin prep pap is not done. Uterus is felt to be normal size, shape, and contour.  No adnexal masses or tenderness noted. Extremities:  No swelling or varicosities noted Psych:  She has a normal mood and affect  Assessment:   Healthy well-woman exam Vit D deficiency ADD metromenorrhagia  Plan:  meds refilled and will try LoSeasonique for suppression of menses.  F/U 1 year for AE, or sooner if needed   Melody Rockney Ghee, CNM

## 2018-10-27 IMAGING — CT CT ABD-PEL WO/W CM
1 of 4 series · 11 of 32 positions shown, 17 images · IV contrast (APPLIED)
Comparison: No priors.

CLINICAL DATA: 45-year-old female with history of right-sided flank
pain for the past 2 weeks and microhematuria.

EXAM:
CT ABDOMEN AND PELVIS WITHOUT AND WITH CONTRAST
TECHNIQUE: Multidetector CT imaging of the abdomen and pelvis was performed
following the standard protocol before and following the bolus
administration of intravenous contrast.
CONTRAST:  150mL OKRB6L-AAA IOPAMIDOL (OKRB6L-AAA) INJECTION 61%

[Series 8: axial delay · axial · delayed · 0.71mm/px · z∈[-1017,-642]mm · 11 of 91 slices shown, 17 images]
[im 8/91  soft-tissue]
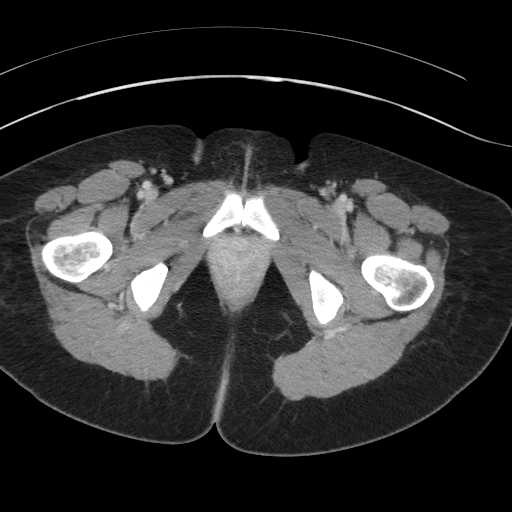
[im 8/91  bone]
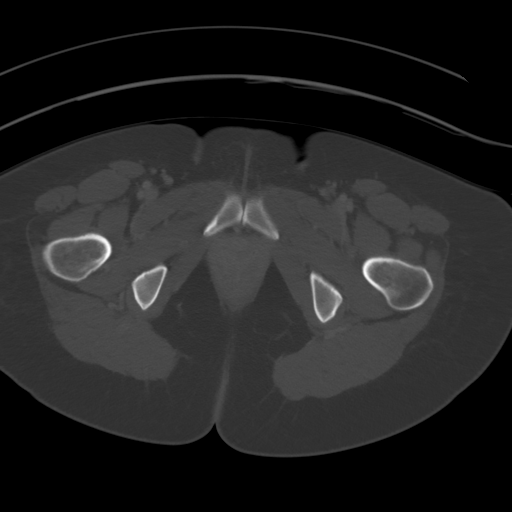
[im 16/91  soft-tissue]
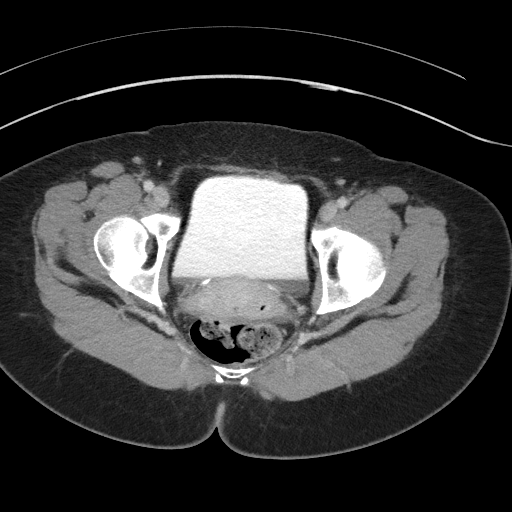
[im 23/91  soft-tissue]
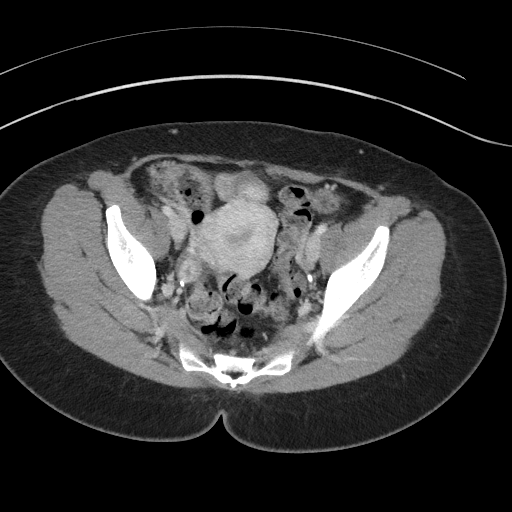
[im 31/91  soft-tissue]
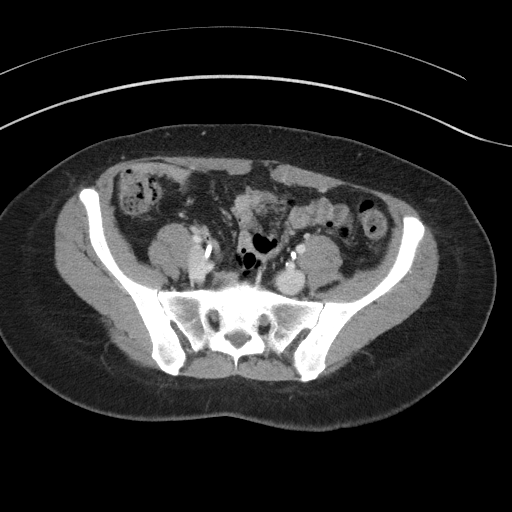
[im 38/91  soft-tissue]
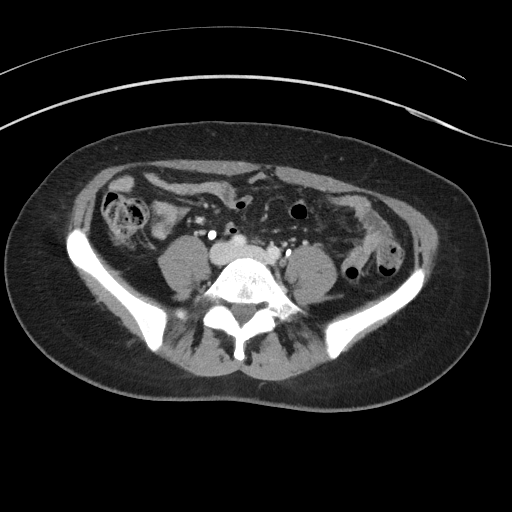
[im 46/91  soft-tissue]
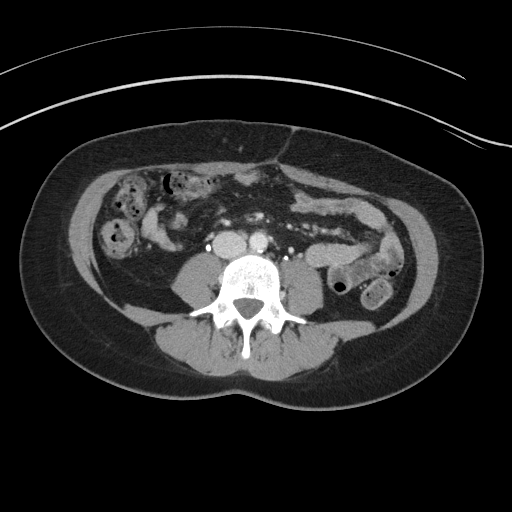
[im 53/91  soft-tissue]
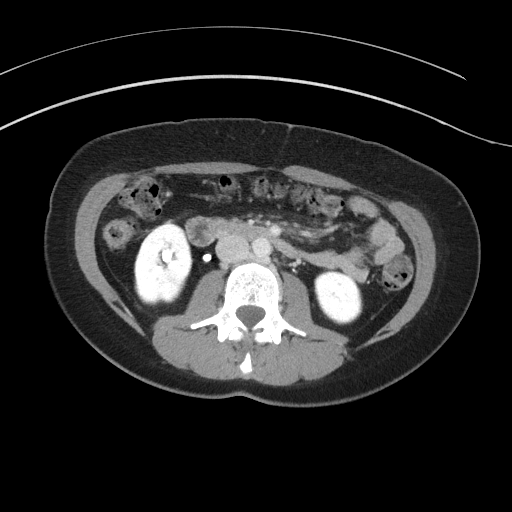
[im 61/91  soft-tissue]
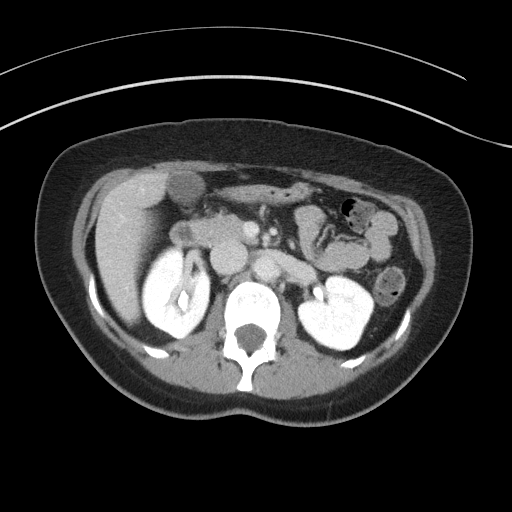
[im 61/91  lung]
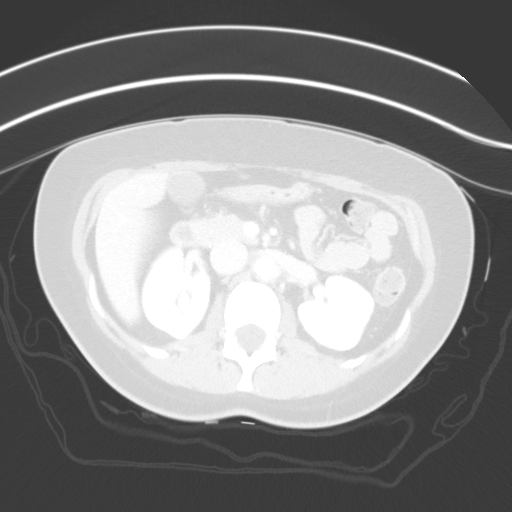
[im 68/91  soft-tissue]
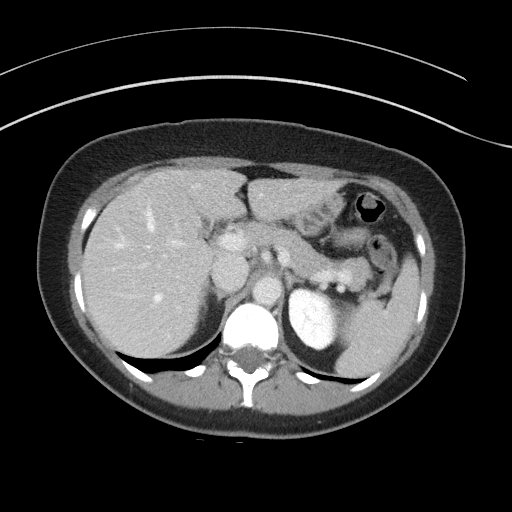
[im 68/91  lung]
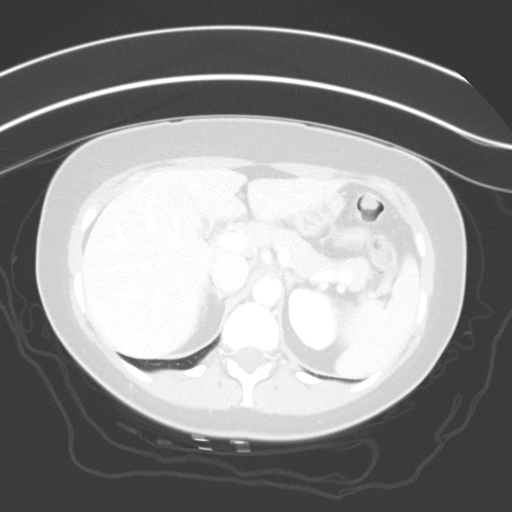
[im 68/91  bone]
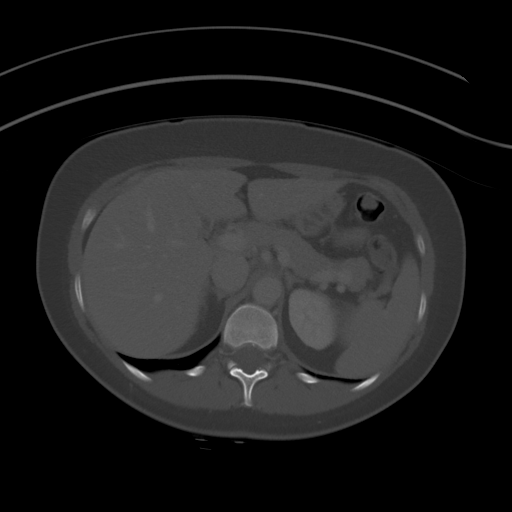
[im 76/91  soft-tissue]
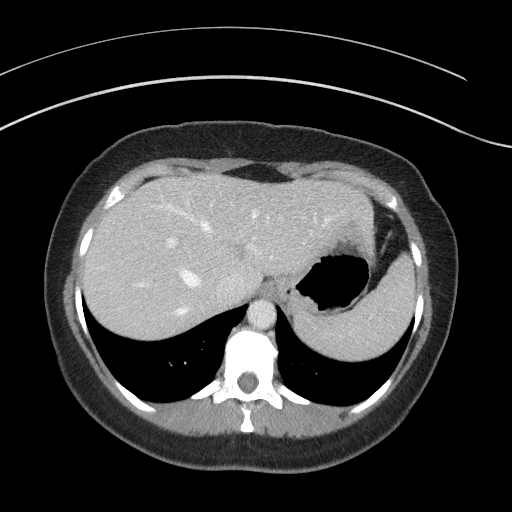
[im 76/91  lung]
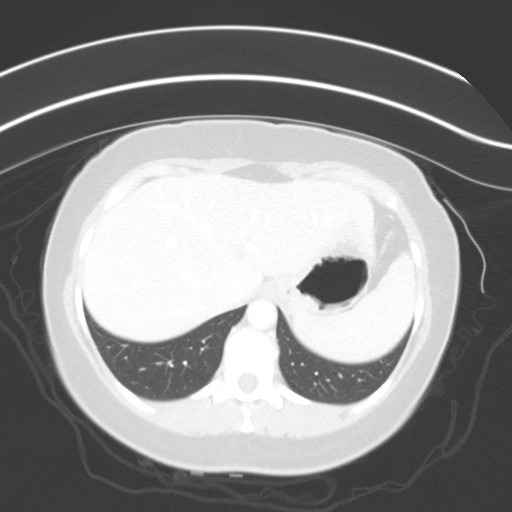
[im 83/91  soft-tissue]
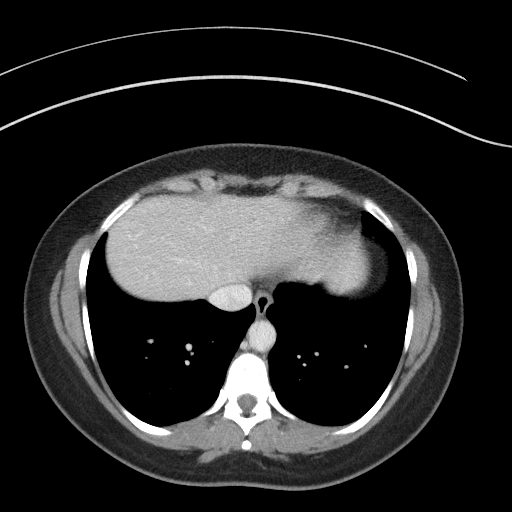
[im 83/91  lung]
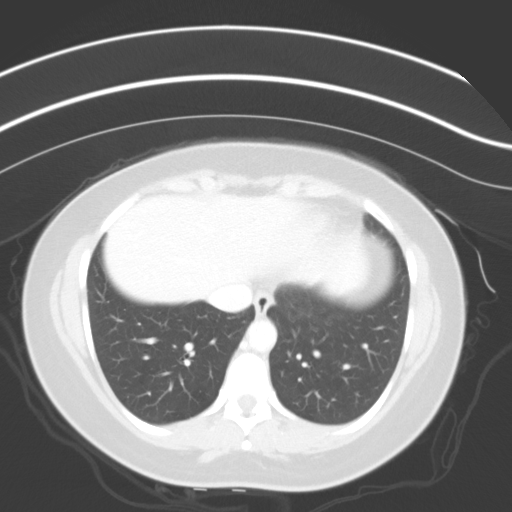

[11 of 32 positions shown; findings below may reference images not displayed]

FINDINGS: Lower chest: Unremarkable.

Hepatobiliary: No cystic or solid hepatic lesions. No intra or
extrahepatic biliary ductal dilatation. Gallbladder is normal in
appearance.

Pancreas: No pancreatic mass. No pancreatic ductal dilatation. No
pancreatic or peripancreatic fluid or inflammatory changes.

Spleen: Unremarkable.

Adrenals/Urinary Tract: Precontrast images demonstrate no
calcifications within the collecting system of either kidney, along
the course of either ureter, or within the lumen of the urinary
bladder. Post contrast images demonstrate no focal solid renal
lesions. Post contrast delayed images demonstrate no definite
filling defects within the collecting system of either kidney, along
the course of either ureter, or within the lumen of the urinary
bladder to strongly suggest the presence of a urothelial neoplasm.
Urinary bladder is normal in appearance. Bilateral adrenal glands
are normal in appearance.

Stomach/Bowel: The stomach is normal in appearance. No pathologic
dilatation of small bowel or colon. Normal appendix.

Vascular/Lymphatic: Aortic atherosclerosis, without evidence of
aneurysm or dissection in the abdominal or pelvic vasculature. No
lymphadenopathy noted in the abdomen or pelvis.

Reproductive: Uterus and ovaries are unremarkable in appearance.

Other: Trace volume of free fluid in the cul-de-sac, presumably
physiologic in this young female patient. No significant volume of
ascites. No pneumoperitoneum.

Musculoskeletal: There are no aggressive appearing lytic or blastic
lesions noted in the visualized portions of the skeleton.
IMPRESSION: 1. No explanation for the patient's history of flank pain or
microhematuria. Specifically, no urinary tract calculi no findings
of urinary tract obstruction are noted at this time.
2. No acute findings in the abdomen or pelvis.
3. Aortic atherosclerosis.
4. Normal appendix.

## 2018-10-29 ENCOUNTER — Other Ambulatory Visit: Payer: Self-pay | Admitting: Obstetrics and Gynecology

## 2018-10-29 MED ORDER — NORETHIN ACE-ETH ESTRAD-FE 1-20 MG-MCG PO TABS: 1.0000 | ORAL_TABLET | Freq: Every day | ORAL | 11 refills | Status: DC

## 2019-01-22 ENCOUNTER — Other Ambulatory Visit: Payer: Self-pay

## 2019-02-24 ENCOUNTER — Other Ambulatory Visit: Payer: Self-pay

## 2019-02-25 ENCOUNTER — Telehealth: Payer: Self-pay

## 2019-02-25 NOTE — Telephone Encounter (Signed)
mychart message sent to patient explaining Dr Dessie Coma instructions.

## 2019-02-25 NOTE — Telephone Encounter (Signed)
Jennifer Rowland, we are bringing in all patients on these types of medications to be re-evaluated by a provider (the other midwives are also included as a provider, but I don't think Dr. Amalia Hailey will prescribe) to meet them and reassess patient prior to refilling.  Apologize to the patient for the inconvenience but we want to make sure that all patients and providers are comfortable with the management plans moving forward (as different providers may have some variations in how they practice).   Dr. Marcelline Mates

## 2019-03-03 ENCOUNTER — Telehealth: Payer: Self-pay

## 2019-03-03 ENCOUNTER — Ambulatory Visit (INDEPENDENT_AMBULATORY_CARE_PROVIDER_SITE_OTHER): Payer: BC Managed Care – PPO | Admitting: Certified Nurse Midwife

## 2019-03-03 ENCOUNTER — Encounter: Payer: Self-pay | Admitting: Certified Nurse Midwife

## 2019-03-03 ENCOUNTER — Other Ambulatory Visit: Payer: Self-pay

## 2019-03-03 VITALS — BP 157/85 | HR 81 | Ht 63.0 in | Wt 118.5 lb

## 2019-03-03 DIAGNOSIS — Z79899 Other long term (current) drug therapy: Secondary | ICD-10-CM

## 2019-03-03 DIAGNOSIS — F419 Anxiety disorder, unspecified: Secondary | ICD-10-CM

## 2019-03-03 DIAGNOSIS — G4700 Insomnia, unspecified: Secondary | ICD-10-CM

## 2019-03-03 NOTE — Patient Instructions (Signed)

## 2019-03-03 NOTE — Progress Notes (Signed)
  Medication Management Clinic Visit Note  Patient: Jennifer Rowland MRN: [id number] Date of Birth: 03-05-1971 PCP: Rusty Aus, MD   Jennifer Rowland 48 y.o. female presents for a refill on triazolam and Adderall  . She was prescribed by Melody Gayla Medicus CNM in 2017 &2018  for insomnia/sleep pattern disturbance and anxiety.    BP (!) 157/85   Pulse 81   Ht 5\' 3"  (1.6 m)   Wt 118 lb 8 oz (53.8 kg)   BMI 20.99 kg/m   Patient Information   Past Medical History:  Diagnosis Date  . Anxiety   . Libido, decreased   . Sleep pattern disturbance       Past Surgical History:  Procedure Laterality Date  . BREAST BIOPSY Right 2011   CORE W/CLIP - NEG  . CESAREAN SECTION  1997     Family History  Problem Relation Age of Onset  . Heart disease Father   . Cancer Maternal Grandmother        ovarian  . Prostate cancer Maternal Grandfather   . Diabetes Maternal Grandfather   . Breast cancer Neg Hx   . Kidney disease Neg Hx   . Kidney cancer Neg Hx   . Bladder Cancer Neg Hx   . Colon cancer Neg Hx              Social History   Substance and Sexual Activity  Alcohol Use Yes   Comment: occas      Social History   Tobacco Use  Smoking Status Never Smoker  Smokeless Tobacco Never Used      Health Maintenance  Topic Date Due  . HIV Screening  02/12/1987  . TETANUS/TDAP  02/12/1991  . PAP SMEAR-Modifier  07/19/2020  . INFLUENZA VACCINE  Completed     Assessment and Plan: Discussed need for evaluation by behavioral health provider, informed pt that I do not feel comfortable continuing medications. Recommend use of other medications to assist with sleep and cognitive behavior therapy. Offered to place referral. Pt states that she will follow up with her PCP and declines referrals. Pt states if she would  Have know that I was not going to refill the prescriptions she would not have come in. Refund given for co-pay. No charge visit.   Philip Aspen, CNM

## 2019-03-03 NOTE — Telephone Encounter (Signed)
Voicemail message left for patient- if appointment today was for a refill on Halcion- per AT she will not refill it. Patient was advised to see her PCP or make an appointment with one of our doctors.

## 2019-10-29 ENCOUNTER — Encounter: Payer: 59 | Admitting: Obstetrics and Gynecology

## 2020-01-13 ENCOUNTER — Other Ambulatory Visit: Payer: Self-pay | Admitting: Internal Medicine

## 2020-01-13 DIAGNOSIS — Z1231 Encounter for screening mammogram for malignant neoplasm of breast: Secondary | ICD-10-CM

## 2020-02-09 ENCOUNTER — Ambulatory Visit
Admission: RE | Admit: 2020-02-09 | Discharge: 2020-02-09 | Disposition: A | Discharge status: Home / Self Care | Payer: BC Managed Care – PPO | Source: Ambulatory Visit | Attending: Internal Medicine | Admitting: Internal Medicine

## 2020-02-09 ENCOUNTER — Other Ambulatory Visit: Payer: Self-pay

## 2020-02-09 DIAGNOSIS — Z1231 Encounter for screening mammogram for malignant neoplasm of breast: Secondary | ICD-10-CM | POA: Diagnosis present

## 2020-02-15 ENCOUNTER — Other Ambulatory Visit: Payer: Self-pay | Admitting: Internal Medicine

## 2020-02-15 DIAGNOSIS — R928 Other abnormal and inconclusive findings on diagnostic imaging of breast: Secondary | ICD-10-CM

## 2020-02-15 DIAGNOSIS — N6489 Other specified disorders of breast: Secondary | ICD-10-CM

## 2020-02-29 ENCOUNTER — Other Ambulatory Visit: Payer: Self-pay

## 2020-02-29 ENCOUNTER — Ambulatory Visit
Admission: RE | Admit: 2020-02-29 | Discharge: 2020-02-29 | Disposition: A | Discharge status: Home / Self Care | Payer: BC Managed Care – PPO | Source: Ambulatory Visit | Attending: Internal Medicine | Admitting: Internal Medicine

## 2020-02-29 DIAGNOSIS — R928 Other abnormal and inconclusive findings on diagnostic imaging of breast: Secondary | ICD-10-CM

## 2020-02-29 DIAGNOSIS — N6489 Other specified disorders of breast: Secondary | ICD-10-CM | POA: Insufficient documentation

## 2020-12-01 ENCOUNTER — Other Ambulatory Visit: Payer: Self-pay | Admitting: Obstetrics and Gynecology

## 2021-01-16 NOTE — H&P (Signed)
clots Ms. Jennifer Rowland is a 50 y.o. female here for LAVH and Bilateral salpingectomy .Marland Kitchen Pt  may bleed for 7-10 days ++ bleeding .  She was tried on OCPs in past which didn't work .  Some increasing dyspareunia  G2P2 SVD x1 C/s x1  she also tells me today that she has had 18 months of worsening leakage of urine with cough sneeze . Kegels exercises have not alleviated . No significant urgency .   embx - neg 09/09/20, pap - neg    U/s _ 10/21/20:  Utx  wnl          Endometrium=10.11 mm   bil ovs wnl     Past Medical History:  has a past medical history of Sleep pattern disturbance.  Past Surgical History:  has a past surgical history that includes Breast excisional biopsy and Cesarean section. Family History: family history includes Alcohol abuse in her father; Glaucoma in her father; Prostate cancer in her maternal grandfather. Social History:  reports that she has never smoked. She has never used smokeless tobacco. She reports current alcohol use. She reports that she does not use drugs. OB/GYN History:          OB History     Gravida  2   Para  2   Term      Preterm      AB      Living  1      SAB      IAB      Ectopic      Molar      Multiple      Live Births  1             Allergies: is allergic to oxycodone-acetaminophen. Medications:   Current Outpatient Medications:    amLODIPine (NORVASC) 5 MG tablet, Take 1 tablet (5 mg total) by mouth nightly, Disp: 90 tablet, Rfl: 3   cyanocobalamin (VITAMIN B12) 1,000 mcg/mL injection, Inject 1 mL (1,000 mcg total) into the muscle monthly, Disp: 10 mL, Rfl: 2   dextroamphetamine-amphetamine (ADDERALL) 15 mg tablet, Take 1 tablet (15 mg total) by mouth every morning for 90 days, Disp: 90 tablet, Rfl: 0   multivitamin capsule, Take 1 capsule by mouth once daily, Disp: , Rfl:    tirzepatide (MOUNJARO) 5 mg/0.5 mL PnIj, Inject 5 mg subcutaneously every 7 (seven) days, Disp: 0.5 mL, Rfl: 2   dextroamphetamine-amphetamine  (ADDERALL) 15 mg tablet, Take 1 tablet (15 mg total) by mouth every morning for 30 days, Disp: 90 tablet, Rfl: 0   syringe with needle 1 mL 25 gauge x 1" syringe, Use as directed (for B12 Injections) (Patient not taking: No sig reported), Disp: 12 each, Rfl: 0   Review of Systems: General:                      No fatigue or weight loss Eyes:                           No vision changes Ears:                            No hearing difficulty Respiratory:                No cough or shortness of breath Pulmonary:  No asthma or shortness of breath Cardiovascular:           No chest pain, palpitations, dyspnea on exertion Gastrointestinal:          No abdominal bloating, chronic diarrhea, constipations, masses, pain or hematochezia Genitourinary:             No hematuria, dysuria, abnormal vaginal discharge, pelvic pain, Menometrorrhagia Lymphatic:                   No swollen lymph nodes Musculoskeletal:         No muscle weakness Neurologic:                  No extremity weakness, syncope, seizure disorder Psychiatric:                  No history of depression, delusions or suicidal/homicidal ideation      Exam:       Vitals:    01/16/21 1447  BP: 124/81  Pulse: 76      Body mass index is 25.86 kg/m.   WDWN white/  female in NAD   Lungs: CTA  CV : RRR without murmur   Breast: exam done in sitting and lying position : No dimpling or retraction, no dominant mass, no spontaneous discharge, no axillary adenopathy Neck:  no thyromegaly Abdomen: soft , no mass, normal active bowel sounds,  non-tender, no rebound tenderness Pelvic: tanner stage 5 ,  External genitalia: vulva /labia no lesions Urethra: no prolapse Vagina: normal physiologic d/c, Adequate room for LAVH Cervix: no lesions, no cervical motion tenderness   Uterus: normal size shape and contour, non-tender Adnexa: no mass,  non-tender   Rectovaginal:   Impression:    The primary encounter diagnosis was  Menorrhagia with irregular cycle. Diagnoses of Dyspareunia, female a    Plan:     She wishes to proceed with the original LAVH and BS  Kegel exercises instruction explained to the patient  Husband was present for the counseling . All questions answered            Return for preop.   Caroline Sauger, MD

## 2021-01-20 ENCOUNTER — Other Ambulatory Visit: Payer: Self-pay

## 2021-01-20 ENCOUNTER — Encounter
Admission: RE | Admit: 2021-01-20 | Discharge: 2021-01-20 | Disposition: A | Discharge status: Home / Self Care | Payer: BC Managed Care – PPO | Source: Ambulatory Visit | Attending: Obstetrics and Gynecology | Admitting: Obstetrics and Gynecology

## 2021-01-20 HISTORY — DX: Other specified behavioral and emotional disorders with onset usually occurring in childhood and adolescence: F98.8

## 2021-01-20 HISTORY — DX: Essential (primary) hypertension: I10

## 2021-01-20 NOTE — Patient Instructions (Addendum)
Your procedure is scheduled on: 01/30/21 Report to [facility]. To find out your arrival time please call [phone] between 1PM - 3PM on 01/27/21.  Remember: Instructions that are not followed completely may result in serious medical risk, up to and including death, or upon the discretion of your surgeon and anesthesiologist your surgery may need to be rescheduled.     _X__ 1. Do not eat food after midnight the night before your procedure.                 No gum chewing or hard candies. You may drink clear liquids up to 2 hours                 before you are scheduled to arrive for your surgery- DO not drink clear                 liquids within 2 hours of the start of your surgery.                 Clear Liquids include:  water, apple juice without pulp, clear carbohydrate                 drink such as Clearfast or Gatorade, Black Coffee or Tea (Do not add                 anything to coffee or tea). Diabetics water only  DRINK THE ENSURE "CLEAR" PRE SURGERY DRINK 2 HOURS PRIOR TO SURGERY ARRIVAL  __X__2.  On the morning of surgery brush your teeth with toothpaste and water, you                 may rinse your mouth with mouthwash if you wish.  Do not swallow any              toothpaste of mouthwash.     _X__ 3.  No Alcohol for 24 hours before or after surgery.   _X__ 4.  Do Not Smoke or use e-cigarettes For 24 Hours Prior to Your Surgery.                 Do not use any chewable tobacco products for at least 6 hours prior to                 surgery.  ____  5.  Bring all medications with you on the day of surgery if instructed.   __X__  6.  Notify your doctor if there is any change in your medical condition      (cold, fever, infections).     Do not wear jewelry, make-up, hairpins, clips or nail polish. Do not wear lotions, powders, or perfumes.  Do not shave body hair 48 hours prior to surgery. Men may shave face and neck. Do not  bring valuables to the hospital.    [facility] is not responsible for any belongings or valuables.  Contacts, dentures/partials or body piercings may not be worn into surgery. Bring a case for your contacts, glasses or hearing aids, a denture cup will be supplied. Leave your suitcase in the car. After surgery it may be brought to your room. For patients admitted to the hospital, discharge time is determined by your treatment team.   Patients discharged the day of surgery will not be allowed to drive home.   Please read over the following fact sheets that you were given:   Incentive Spirometer,  CHG soap, Ensure drink  __X__ Take these medicines the morning of surgery with A SIP OF WATER:    1. none  2.   3.   4.  5.  6.  ____ Fleet Enema (as directed)   __X__ Use CHG Soap/SAGE wipes as directed  ____ Use inhalers on the day of surgery  ____ Stop metformin/Janumet/Farxiga 2 days prior to surgery    ____ Take 1/2 of usual insulin dose the night before surgery. No insulin the morning          of surgery.   ____ Stop Blood Thinners Coumadin/Plavix/Xarelto/Pleta/Pradaxa/Eliquis/Effient/Aspirin  on   Or contact your Surgeon, Cardiologist or Medical Doctor regarding  ability to stop your blood thinners  __X__ Stop Anti-inflammatories 7 days before surgery such as Advil, Ibuprofen, Motrin,  BC or Goodies Powder, Naprosyn, Naproxen, Aleve, Aspirin    __X__ Stop all herbal supplements, fish oil or vitamin E until after surgery.    ____ Bring C-Pap to the hospital.      How to Use an Incentive Spirometer An incentive spirometer is a tool that measures how well you are filling your lungs with each breath. Learning to take long, deep breaths using this tool can help you keep your lungs clear and active. This may help to reverse or lessen your chance of developing breathing (pulmonary) problems, especially infection. You may be asked to use a spirometer: After a surgery. If you have  a lung problem or a history of smoking. After a long period of time when you have been unable to move or be active. If the spirometer includes an indicator to show the highest number that you have reached, your health care provider or respiratory therapist will help you set a goal. Keep a log of your progress as told by your health care provider. What are the risks? Breathing too quickly may cause dizziness or cause you to pass out. Take your time so you do not get dizzy or light-headed. If you are in pain, you may need to take pain medicine before doing incentive spirometry. It is harder to take a deep breath if you are having pain. How to use your incentive spirometer  Sit up on the edge of your bed or on a chair. Hold the incentive spirometer so that it is in an upright position. Before you use the spirometer, breathe out normally. Place the mouthpiece in your mouth. Make sure your lips are closed tightly around it. Breathe in slowly and as deeply as you can through your mouth, causing the piston or the ball to rise toward the top of the chamber. Hold your breath for 3-5 seconds, or for as long as possible. If the spirometer includes a coach indicator, use this to guide you in breathing. Slow down your breathing if the indicator goes above the marked areas. Remove the mouthpiece from your mouth and breathe out normally. The piston or ball will return to the bottom of the chamber. Rest for a few seconds, then repeat the steps 10 or more times. Take your time and take a few normal breaths between deep breaths so that you do not get dizzy or light-headed. Do this every 1-2 hours when you are awake. If the spirometer includes a goal marker to show the highest number you have reached (best effort), use this as a goal to work toward during each repetition. After each set of 10 deep breaths, cough a few times. This will help to make sure that your  lungs are clear. If you have an incision on your  chest or abdomen from surgery, place a pillow or a rolled-up towel firmly against the incision when you cough. This can help to reduce pain while taking deep breaths and coughing. General tips When you are able to get out of bed: Walk around often. Continue to take deep breaths and cough in order to clear your lungs. Keep using the incentive spirometer until your health care provider says it is okay to stop using it. If you have been in the hospital, you may be told to keep using the spirometer at home. Contact a health care provider if: You are having difficulty using the spirometer. You have trouble using the spirometer as often as instructed. Your pain medicine is not giving enough relief for you to use the spirometer as told. You have a fever. Get help right away if: You develop shortness of breath. You develop a cough with bloody mucus from the lungs. You have fluid or blood coming from an incision site after you cough. Summary An incentive spirometer is a tool that can help you learn to take long, deep breaths to keep your lungs clear and active. You may be asked to use a spirometer after a surgery, if you have a lung problem or a history of smoking, or if you have been inactive for a long period of time. Use your incentive spirometer as instructed every 1-2 hours while you are awake. If you have an incision on your chest or abdomen, place a pillow or a rolled-up towel firmly against your incision when you cough. This will help to reduce pain. Get help right away if you have shortness of breath, you cough up bloody mucus, or blood comes from your incision when you cough. This information is not intended to replace advice given to you by your health care provider. Make sure you discuss any questions you have with your health care provider. Document Revised: 06/22/2019 Document Reviewed: 06/22/2019 Elsevier Patient Education  [place].

## 2021-01-23 ENCOUNTER — Other Ambulatory Visit: Payer: Self-pay

## 2021-01-23 ENCOUNTER — Encounter
Admission: RE | Admit: 2021-01-23 | Discharge: 2021-01-23 | Disposition: A | Discharge status: Home / Self Care | Payer: BC Managed Care – PPO | Source: Ambulatory Visit | Attending: Obstetrics and Gynecology | Admitting: Obstetrics and Gynecology

## 2021-01-23 DIAGNOSIS — I10 Essential (primary) hypertension: Secondary | ICD-10-CM | POA: Diagnosis not present

## 2021-01-23 DIAGNOSIS — Z01818 Encounter for other preprocedural examination: Secondary | ICD-10-CM | POA: Insufficient documentation

## 2021-01-23 LAB — TYPE AND SCREEN
ABO/RH(D): A NEG
Antibody Screen: NEGATIVE

## 2021-01-23 LAB — CBC
HCT: 41.2 % (ref 36.0–46.0)
Hemoglobin: 13.8 g/dL (ref 12.0–15.0)
MCH: 28.8 pg (ref 26.0–34.0)
MCHC: 33.5 g/dL (ref 30.0–36.0)
MCV: 86 fL (ref 80.0–100.0)
Platelets: 258 10*3/uL (ref 150–400)
RBC: 4.79 MIL/uL (ref 3.87–5.11)
RDW: 12.2 % (ref 11.5–15.5)
WBC: 5.4 10*3/uL (ref 4.0–10.5)
nRBC: 0 % (ref 0.0–0.2)

## 2021-01-23 LAB — BASIC METABOLIC PANEL
Anion gap: 6 (ref 5–15)
BUN: 15 mg/dL (ref 6–20)
CO2: 26 mmol/L (ref 22–32)
Calcium: 8.8 mg/dL — ABNORMAL LOW (ref 8.9–10.3)
Chloride: 106 mmol/L (ref 98–111)
Creatinine, Ser: 0.76 mg/dL (ref 0.44–1.00)
GFR, Estimated: >60 mL/min (ref >60)
Glucose, Bld: 78 mg/dL (ref 70–99)
Potassium: 3.6 mmol/L (ref 3.5–5.1)
Sodium: 138 mmol/L (ref 135–145)

## 2021-01-29 MED ORDER — FAMOTIDINE 20 MG PO TABS: 20.0000 mg | ORAL_TABLET | Freq: Once | ORAL | Status: AC

## 2021-01-29 MED ORDER — CHLORHEXIDINE GLUCONATE 0.12 % MT SOLN: 15.0000 mL | Freq: Once | OROMUCOSAL | Status: AC

## 2021-01-29 MED ORDER — ACETAMINOPHEN 500 MG PO TABS: 1000.0000 mg | ORAL_TABLET | ORAL | Status: AC

## 2021-01-29 MED ORDER — LACTATED RINGERS IV SOLN: INTRAVENOUS | Status: DC

## 2021-01-29 MED ORDER — POVIDONE-IODINE 10 % EX SWAB
2.0000 | Freq: Once | CUTANEOUS | Status: AC
Start: 2021-01-29 — End: 2021-01-30
  Administered 2021-01-30: 2 via TOPICAL

## 2021-01-29 MED ORDER — GABAPENTIN 300 MG PO CAPS: 300.0000 mg | ORAL_CAPSULE | ORAL | Status: AC

## 2021-01-29 MED ORDER — ORAL CARE MOUTH RINSE: 15.0000 mL | Freq: Once | OROMUCOSAL | Status: AC

## 2021-01-29 MED ORDER — CEFAZOLIN SODIUM-DEXTROSE 2-4 GM/100ML-% IV SOLN
2.0000 g | Freq: Once | INTRAVENOUS | Status: AC
  Administered 2021-01-30: 2 g via INTRAVENOUS

## 2021-01-30 ENCOUNTER — Ambulatory Visit: Payer: BC Managed Care – PPO | Admitting: Registered Nurse

## 2021-01-30 ENCOUNTER — Ambulatory Visit
Admission: RE | Admit: 2021-01-30 | Discharge: 2021-01-30 | Disposition: A | Discharge status: Home / Self Care | Payer: BC Managed Care – PPO | Attending: Obstetrics and Gynecology | Admitting: Obstetrics and Gynecology

## 2021-01-30 ENCOUNTER — Ambulatory Visit: Payer: BC Managed Care – PPO | Admitting: Urgent Care

## 2021-01-30 ENCOUNTER — Other Ambulatory Visit: Payer: Self-pay

## 2021-01-30 ENCOUNTER — Encounter
Admission: RE | Disposition: A | Discharge status: Home / Self Care | Payer: Self-pay | Source: Home / Self Care | Attending: Obstetrics and Gynecology

## 2021-01-30 ENCOUNTER — Encounter: Payer: Self-pay | Admitting: Obstetrics and Gynecology

## 2021-01-30 DIAGNOSIS — N736 Female pelvic peritoneal adhesions (postinfective): Secondary | ICD-10-CM | POA: Diagnosis not present

## 2021-01-30 DIAGNOSIS — N920 Excessive and frequent menstruation with regular cycle: Secondary | ICD-10-CM | POA: Insufficient documentation

## 2021-01-30 DIAGNOSIS — N83202 Unspecified ovarian cyst, left side: Secondary | ICD-10-CM | POA: Diagnosis not present

## 2021-01-30 DIAGNOSIS — Z886 Allergy status to analgesic agent status: Secondary | ICD-10-CM | POA: Diagnosis not present

## 2021-01-30 DIAGNOSIS — N941 Unspecified dyspareunia: Secondary | ICD-10-CM | POA: Insufficient documentation

## 2021-01-30 DIAGNOSIS — D251 Intramural leiomyoma of uterus: Secondary | ICD-10-CM | POA: Insufficient documentation

## 2021-01-30 DIAGNOSIS — Z885 Allergy status to narcotic agent status: Secondary | ICD-10-CM | POA: Diagnosis not present

## 2021-01-30 DIAGNOSIS — Z79899 Other long term (current) drug therapy: Secondary | ICD-10-CM | POA: Insufficient documentation

## 2021-01-30 HISTORY — PX: LAPAROSCOPIC VAGINAL HYSTERECTOMY WITH SALPINGECTOMY: SHX6680

## 2021-01-30 LAB — ABO/RH: ABO/RH(D): A NEG

## 2021-01-30 LAB — POCT PREGNANCY, URINE: Preg Test, Ur: NEGATIVE

## 2021-01-30 SURGERY — HYSTERECTOMY, VAGINAL, LAPAROSCOPY-ASSISTED, WITH SALPINGECTOMY
Anesthesia: General | Laterality: Bilateral

## 2021-01-30 MED ORDER — CHLORHEXIDINE GLUCONATE 0.12 % MT SOLN
OROMUCOSAL | Status: AC
  Administered 2021-01-30: 15 mL via OROMUCOSAL
  Filled 2021-01-30: qty 15

## 2021-01-30 MED ORDER — GABAPENTIN 300 MG PO CAPS
ORAL_CAPSULE | ORAL | Status: AC
  Administered 2021-01-30: 300 mg via ORAL
  Filled 2021-01-30: qty 1

## 2021-01-30 MED ORDER — OXYCODONE-ACETAMINOPHEN 5-325 MG PO TABS
ORAL_TABLET | ORAL | Status: AC
  Filled 2021-01-30: qty 1

## 2021-01-30 MED ORDER — FENTANYL CITRATE (PF) 100 MCG/2ML IJ SOLN
INTRAMUSCULAR | Status: AC
  Filled 2021-01-30: qty 2

## 2021-01-30 MED ORDER — PROPOFOL 10 MG/ML IV BOLUS
INTRAVENOUS | Status: AC
  Filled 2021-01-30: qty 20

## 2021-01-30 MED ORDER — HEMOSTATIC AGENTS (NO CHARGE) OPTIME
TOPICAL | Status: DC | PRN
Start: 2021-01-30 — End: 2021-01-30
  Administered 2021-01-30: 1 via TOPICAL

## 2021-01-30 MED ORDER — BUPIVACAINE HCL 0.5 % IJ SOLN
INTRAMUSCULAR | Status: DC | PRN
  Administered 2021-01-30: 9 mL

## 2021-01-30 MED ORDER — LIDOCAINE-EPINEPHRINE 1 %-1:100000 IJ SOLN
INTRAMUSCULAR | Status: DC | PRN
  Administered 2021-01-30: 9 mL

## 2021-01-30 MED ORDER — CEFAZOLIN SODIUM-DEXTROSE 2-4 GM/100ML-% IV SOLN
INTRAVENOUS | Status: AC
  Filled 2021-01-30: qty 100

## 2021-01-30 MED ORDER — LIDOCAINE HCL (PF) 2 % IJ SOLN
INTRAMUSCULAR | Status: AC
  Filled 2021-01-30: qty 5

## 2021-01-30 MED ORDER — ONDANSETRON HCL 4 MG/2ML IJ SOLN: 4.0000 mg | Freq: Once | INTRAMUSCULAR | Status: DC | PRN

## 2021-01-30 MED ORDER — ACETAMINOPHEN 10 MG/ML IV SOLN: 1000.0000 mg | Freq: Once | INTRAVENOUS | Status: DC | PRN

## 2021-01-30 MED ORDER — FENTANYL CITRATE (PF) 100 MCG/2ML IJ SOLN
25.0000 ug | INTRAMUSCULAR | Status: DC | PRN
  Administered 2021-01-30: 50 ug via INTRAVENOUS
  Administered 2021-01-30: 25 ug via INTRAVENOUS

## 2021-01-30 MED ORDER — ROCURONIUM BROMIDE 100 MG/10ML IV SOLN
INTRAVENOUS | Status: DC | PRN
  Administered 2021-01-30: 40 mg via INTRAVENOUS

## 2021-01-30 MED ORDER — ROCURONIUM BROMIDE 10 MG/ML (PF) SYRINGE
PREFILLED_SYRINGE | INTRAVENOUS | Status: AC
  Filled 2021-01-30: qty 10

## 2021-01-30 MED ORDER — DEXAMETHASONE SODIUM PHOSPHATE 10 MG/ML IJ SOLN
INTRAMUSCULAR | Status: DC | PRN
  Administered 2021-01-30: 10 mg via INTRAVENOUS

## 2021-01-30 MED ORDER — ACETAMINOPHEN 500 MG PO TABS
ORAL_TABLET | ORAL | Status: AC
  Administered 2021-01-30: 1000 mg via ORAL
  Filled 2021-01-30: qty 2

## 2021-01-30 MED ORDER — FAMOTIDINE 20 MG PO TABS
ORAL_TABLET | ORAL | Status: AC
  Administered 2021-01-30: 20 mg via ORAL
  Filled 2021-01-30: qty 1

## 2021-01-30 MED ORDER — HYDROCODONE-ACETAMINOPHEN 5-325 MG PO TABS: 1.0000 | ORAL_TABLET | ORAL | Status: DC | PRN

## 2021-01-30 MED ORDER — LIDOCAINE-EPINEPHRINE 1 %-1:100000 IJ SOLN
INTRAMUSCULAR | Status: AC
  Filled 2021-01-30: qty 1

## 2021-01-30 MED ORDER — LIDOCAINE HCL (CARDIAC) PF 100 MG/5ML IV SOSY
PREFILLED_SYRINGE | INTRAVENOUS | Status: DC | PRN
  Administered 2021-01-30: 60 mg via INTRAVENOUS

## 2021-01-30 MED ORDER — MIDAZOLAM HCL 2 MG/2ML IJ SOLN
INTRAMUSCULAR | Status: DC | PRN
  Administered 2021-01-30 (×2): 1 mg via INTRAVENOUS

## 2021-01-30 MED ORDER — ONDANSETRON HCL 4 MG PO TABS: 8.0000 mg | ORAL_TABLET | Freq: Three times a day (TID) | ORAL | Status: DC | PRN

## 2021-01-30 MED ORDER — MIDAZOLAM HCL 2 MG/2ML IJ SOLN
INTRAMUSCULAR | Status: AC
  Filled 2021-01-30: qty 2

## 2021-01-30 MED ORDER — ONDANSETRON HCL 4 MG/2ML IJ SOLN
INTRAMUSCULAR | Status: DC | PRN
  Administered 2021-01-30: 4 mg via INTRAVENOUS

## 2021-01-30 MED ORDER — LACTATED RINGERS IV SOLN: INTRAVENOUS | Status: DC

## 2021-01-30 MED ORDER — DEXAMETHASONE SODIUM PHOSPHATE 10 MG/ML IJ SOLN
INTRAMUSCULAR | Status: AC
  Filled 2021-01-30: qty 1

## 2021-01-30 MED ORDER — ONDANSETRON HCL 4 MG/2ML IJ SOLN
INTRAMUSCULAR | Status: AC
  Filled 2021-01-30: qty 2

## 2021-01-30 MED ORDER — FENTANYL CITRATE (PF) 100 MCG/2ML IJ SOLN
INTRAMUSCULAR | Status: AC
  Administered 2021-01-30: 25 ug via INTRAVENOUS
  Filled 2021-01-30: qty 2

## 2021-01-30 MED ORDER — OXYCODONE-ACETAMINOPHEN 5-325 MG PO TABS
1.0000 | ORAL_TABLET | ORAL | Status: DC | PRN
Start: 2021-01-30 — End: 2021-01-30
  Administered 2021-01-30: 1 via ORAL

## 2021-01-30 MED ORDER — SODIUM CHLORIDE 0.9 % IR SOLN
Status: DC | PRN
  Administered 2021-01-30: 1000 mL

## 2021-01-30 MED ORDER — PROPOFOL 10 MG/ML IV BOLUS
INTRAVENOUS | Status: DC | PRN
  Administered 2021-01-30: 140 mg via INTRAVENOUS

## 2021-01-30 MED ORDER — FENTANYL CITRATE (PF) 100 MCG/2ML IJ SOLN
INTRAMUSCULAR | Status: DC | PRN
  Administered 2021-01-30 (×3): 50 ug via INTRAVENOUS

## 2021-01-30 MED ORDER — SUGAMMADEX SODIUM 200 MG/2ML IV SOLN
INTRAVENOUS | Status: DC | PRN
  Administered 2021-01-30: 200 mg via INTRAVENOUS

## 2021-01-30 MED ORDER — KETOROLAC TROMETHAMINE 30 MG/ML IJ SOLN
INTRAMUSCULAR | Status: DC | PRN
  Administered 2021-01-30: 30 mg via INTRAVENOUS

## 2021-01-30 MED ORDER — GLYCOPYRROLATE 0.2 MG/ML IJ SOLN
INTRAMUSCULAR | Status: DC | PRN
  Administered 2021-01-30: .2 mg via INTRAVENOUS

## 2021-01-30 MED ORDER — BUPIVACAINE HCL (PF) 0.5 % IJ SOLN
INTRAMUSCULAR | Status: AC
  Filled 2021-01-30: qty 30

## 2021-01-30 SURGICAL SUPPLY — 60 items
APL PRP STRL LF DISP 70% ISPRP (MISCELLANEOUS) ×1
APL SRG 38 LTWT LNG FL B (MISCELLANEOUS) ×1
APPLICATOR ARISTA FLEXITIP XL (MISCELLANEOUS) ×2 IMPLANT
BAG DRN RND TRDRP ANRFLXCHMBR (UROLOGICAL SUPPLIES) ×1
BAG URINE DRAIN 2000ML AR STRL (UROLOGICAL SUPPLIES) ×2 IMPLANT
BLADE SURG SZ11 CARB STEEL (BLADE) ×2 IMPLANT
CATH FOLEY 2WAY  5CC 16FR (CATHETERS) ×2
CATH FOLEY 2WAY 5CC 16FR (CATHETERS) ×2
CATH ROBINSON RED A/P 16FR (CATHETERS) ×2 IMPLANT
CATH URTH 16FR FL 2W BLN LF (CATHETERS) ×2 IMPLANT
CHLORAPREP W/TINT 26 (MISCELLANEOUS) ×2 IMPLANT
DRAPE INCISE IOBAN 66X45 STRL (DRAPES) ×2 IMPLANT
DRAPE SURG 17X11 SM STRL (DRAPES) ×2 IMPLANT
DRSG TEGADERM 2-3/8X2-3/4 SM (GAUZE/BANDAGES/DRESSINGS) ×8 IMPLANT
DRSG TEGADERM 4X4.75 (GAUZE/BANDAGES/DRESSINGS) ×2 IMPLANT
ELECT REM PT RETURN 9FT ADLT (ELECTROSURGICAL) ×2
ELECTRODE REM PT RTRN 9FT ADLT (ELECTROSURGICAL) ×1 IMPLANT
FILTER LAP SMOKE EVAC STRL (MISCELLANEOUS) ×2 IMPLANT
GAUZE 4X4 16PLY ~~LOC~~+RFID DBL (SPONGE) ×6 IMPLANT
GLOVE SURG SYN 8.0 (GLOVE) ×2 IMPLANT
GOWN STRL REUS W/ TWL LRG LVL3 (GOWN DISPOSABLE) ×3 IMPLANT
GOWN STRL REUS W/ TWL XL LVL3 (GOWN DISPOSABLE) ×1 IMPLANT
GOWN STRL REUS W/TWL LRG LVL3 (GOWN DISPOSABLE) ×6
GOWN STRL REUS W/TWL XL LVL3 (GOWN DISPOSABLE) ×2
GRASPER SUT TROCAR 14GX15 (MISCELLANEOUS) IMPLANT
HEMOSTAT ARISTA ABSORB 3G PWDR (HEMOSTASIS) ×2 IMPLANT
IRRIGATION STRYKERFLOW (MISCELLANEOUS) ×1 IMPLANT
IRRIGATOR STRYKERFLOW (MISCELLANEOUS) ×2
IV LACTATED RINGERS 1000ML (IV SOLUTION) ×2 IMPLANT
IV NS 1000ML (IV SOLUTION) ×2
IV NS 1000ML BAXH (IV SOLUTION) ×1 IMPLANT
KIT PINK PAD W/HEAD ARE REST (MISCELLANEOUS) ×2
KIT PINK PAD W/HEAD ARM REST (MISCELLANEOUS) ×1 IMPLANT
KIT TURNOVER CYSTO (KITS) ×2 IMPLANT
LABEL OR SOLS (LABEL) ×2 IMPLANT
MANIFOLD NEPTUNE II (INSTRUMENTS) ×2 IMPLANT
NEEDLE HYPO 22GX1.5 SAFETY (NEEDLE) ×2 IMPLANT
PACK BASIN MINOR ARMC (MISCELLANEOUS) ×2 IMPLANT
PACK GYN LAPAROSCOPIC (MISCELLANEOUS) ×2 IMPLANT
PAD OB MATERNITY 4.3X12.25 (PERSONAL CARE ITEMS) ×2 IMPLANT
SCRUB EXIDINE 4% CHG 4OZ (MISCELLANEOUS) ×2 IMPLANT
SHEARS HARMONIC ACE PLUS 36CM (ENDOMECHANICALS) ×2 IMPLANT
SLEEVE ENDOPATH XCEL 5M (ENDOMECHANICALS) ×4 IMPLANT
SPONGE GAUZE 2X2 8PLY STRL LF (GAUZE/BANDAGES/DRESSINGS) ×6 IMPLANT
STRIP CLOSURE SKIN 1/2X4 (GAUZE/BANDAGES/DRESSINGS) ×2 IMPLANT
SUT PDS 2-0 27IN (SUTURE) IMPLANT
SUT VIC AB 0 CT1 27 (SUTURE) ×4
SUT VIC AB 0 CT1 27XCR 8 STRN (SUTURE) ×2 IMPLANT
SUT VIC AB 0 CT1 36 (SUTURE) ×2 IMPLANT
SUT VIC AB 0 CT2 27 (SUTURE) IMPLANT
SUT VIC AB 2-0 SH 27 (SUTURE) ×2
SUT VIC AB 2-0 SH 27XBRD (SUTURE) ×1 IMPLANT
SUT VIC AB 2-0 UR6 27 (SUTURE) IMPLANT
SUT VIC AB 4-0 SH 27 (SUTURE) ×2
SUT VIC AB 4-0 SH 27XANBCTRL (SUTURE) ×1 IMPLANT
SYR 10ML LL (SYRINGE) ×2 IMPLANT
SYR CONTROL 10ML LL (SYRINGE) ×2 IMPLANT
TROCAR XCEL NON-BLD 5MMX100MML (ENDOMECHANICALS) ×2 IMPLANT
TUBING EVAC SMOKE HEATED PNEUM (TUBING) ×2 IMPLANT
WATER STERILE IRR 500ML POUR (IV SOLUTION) IMPLANT

## 2021-01-30 NOTE — Anesthesia Procedure Notes (Signed)
Procedure Name: Intubation Date/Time: 01/30/2021 10:50 AM Performed by: Doreen Salvage, CRNA Pre-anesthesia Checklist: Patient identified, Patient being monitored, Timeout performed, Emergency Drugs available and Suction available Patient Re-evaluated:Patient Re-evaluated prior to induction Oxygen Delivery Method: Circle system utilized Preoxygenation: Pre-oxygenation with 100% oxygen Induction Type: IV induction Ventilation: Mask ventilation without difficulty Laryngoscope Size: Mac, 3 and McGraph Grade View: Grade I Tube type: Oral Tube size: 7.0 mm Number of attempts: 1 Airway Equipment and Method: Stylet Placement Confirmation: ETT inserted through vocal cords under direct vision, positive ETCO2 and breath sounds checked- equal and bilateral Secured at: 21 cm Tube secured with: Tape Dental Injury: Teeth and Oropharynx as per pre-operative assessment

## 2021-01-30 NOTE — Discharge Instructions (Signed)
AMBULATORY SURGERY  ?DISCHARGE INSTRUCTIONS ? ? ?The drugs that you were given will stay in your system until tomorrow so for the next 24 hours you should not: ? ?Drive an automobile ?Make any legal decisions ?Drink any alcoholic beverage ? ? ?You may resume regular meals tomorrow.  Today it is better to start with liquids and gradually work up to solid foods. ? ?You may eat anything you prefer, but it is better to start with liquids, then soup and crackers, and gradually work up to solid foods. ? ? ?Please notify your doctor immediately if you have any unusual bleeding, trouble breathing, redness and pain at the surgery site, drainage, fever, or pain not relieved by medication. ? ? ? ?Additional Instructions: ? ? ? ?Please contact your physician with any problems or Same Day Surgery at [phone], Monday through Friday 6 am to 4 pm, or [place] [facility] number at [phone].  ?

## 2021-01-30 NOTE — Brief Op Note (Signed)
01/30/2021  12:50 PM  PATIENT:  Jennifer Rowland  50 y.o. female  PRE-OPERATIVE DIAGNOSIS:  menorrhagia, dyspareunia  POST-OPERATIVE DIAGNOSIS:  menorrhagia, dyspareunia  PROCEDURE:  Procedure(s): LAPAROSCOPIC ASSISTED VAGINAL HYSTERECTOMY WITH SALPINGECTOMY (Bilateral) Fulguration of endometriosis Left ovarian cystotomy SURGEON:  Surgeon(s) and Role:    * Turner Baillie, Gwen Her, MD - Primary    * Benjaman Kindler, MD - Assisting  PHYSICIAN ASSISTANT:   ASSISTANTS: none   ANESTHESIA:   general  EBL:  50 mL IOF 1000cc  UO 150 cc  BLOOD ADMINISTERED:none  DRAINS: none   LOCAL MEDICATIONS USED:  LIDOCAINE   SPECIMEN:  Source of Specimen:  cervix, uterus and bilateral fallopian tubes  DISPOSITION OF SPECIMEN:  PATHOLOGY  COUNTS:  YES  TOURNIQUET:  * No tourniquets in log *  DICTATION: .Other Dictation: Dictation Number verbal  PLAN OF CARE: Discharge to home after PACU  PATIENT DISPOSITION:  PACU - hemodynamically stable.   Delay start of Pharmacological VTE agent (>24hrs) due to surgical blood loss or risk of bleeding: not applicable

## 2021-01-30 NOTE — Progress Notes (Signed)
Pr is ready for aa LAVH / BS. No c/o . LAbs reviewed . All questions answered . Post op instructions given

## 2021-01-30 NOTE — Anesthesia Preprocedure Evaluation (Signed)
Anesthesia Evaluation  Patient identified by MRN, date of birth, ID band Patient awake    Reviewed: Allergy & Precautions, NPO status , Patient's Chart, lab work & pertinent test results  History of Anesthesia Complications Negative for: history of anesthetic complications  Airway Mallampati: I   Neck ROM: Full    Dental no notable dental hx.    Pulmonary neg pulmonary ROS,    Pulmonary exam normal breath sounds clear to auscultation       Cardiovascular hypertension, Normal cardiovascular exam Rhythm:Regular Rate:Normal  ECG 01/23/21: normal   Neuro/Psych PSYCHIATRIC DISORDERS (ADD) Anxiety    GI/Hepatic negative GI ROS,   Endo/Other  negative endocrine ROS  Renal/GU negative Renal ROS     Musculoskeletal   Abdominal   Peds  Hematology negative hematology ROS (+)   Anesthesia Other Findings   Reproductive/Obstetrics                             Anesthesia Physical Anesthesia Plan  ASA: 2  Anesthesia Plan: General   Post-op Pain Management:    Induction: Intravenous  PONV Risk Score and Plan: 3 and Ondansetron, Dexamethasone and Treatment may vary due to age or medical condition  Airway Management Planned:   Additional Equipment:   Intra-op Plan:   Post-operative Plan: Extubation in OR  Informed Consent: I have reviewed the patients History and Physical, chart, labs and discussed the procedure including the risks, benefits and alternatives for the proposed anesthesia with the patient or authorized representative who has indicated his/her understanding and acceptance.       Plan Discussed with: CRNA  Anesthesia Plan Comments:         Anesthesia Quick Evaluation

## 2021-01-30 NOTE — Transfer of Care (Signed)
Immediate Anesthesia Transfer of Care Note  Patient: Jennifer Rowland  Procedure(s) Performed: LAPAROSCOPIC ASSISTED VAGINAL HYSTERECTOMY WITH SALPINGECTOMY (Bilateral)  Patient Location: PACU  Anesthesia Type:General  Level of Consciousness: awake and drowsy  Airway & Oxygen Therapy: Patient Spontanous Breathing and Patient connected to face mask oxygen  Post-op Assessment: Report given to RN and Post -op Vital signs reviewed and stable  Post vital signs: Reviewed and stable  Last Vitals:  Vitals Value Taken Time  BP 155/83 01/30/21 1315  Temp 36.3 C 01/30/21 1308  Pulse 76 01/30/21 1315  Resp 16 01/30/21 1315  SpO2 100 % 01/30/21 1315  Vitals shown include unvalidated device data.  Last Pain:  Vitals:   01/30/21 1308  TempSrc: Temporal  PainSc:          Complications: No notable events documented.

## 2021-01-30 NOTE — Anesthesia Postprocedure Evaluation (Signed)
Anesthesia Post Note  Patient: Jennifer Rowland  Procedure(s) Performed: LAPAROSCOPIC ASSISTED VAGINAL HYSTERECTOMY WITH SALPINGECTOMY (Bilateral)  Patient location during evaluation: PACU Anesthesia Type: General Level of consciousness: awake and alert, oriented and patient cooperative Pain management: pain level controlled Vital Signs Assessment: post-procedure vital signs reviewed and stable Respiratory status: spontaneous breathing, nonlabored ventilation and respiratory function stable Cardiovascular status: blood pressure returned to baseline and stable Postop Assessment: adequate PO intake Anesthetic complications: no   No notable events documented.   Last Vitals:  Vitals:   01/30/21 1330 01/30/21 1335  BP: (!) 164/89   Pulse: 65 69  Resp: 12 12  Temp:    SpO2: 100% 100%    Last Pain:  Vitals:   01/30/21 1335  TempSrc:   PainSc: [place]

## 2021-01-31 ENCOUNTER — Encounter: Payer: Self-pay | Admitting: Obstetrics and Gynecology

## 2021-01-31 LAB — SURGICAL PATHOLOGY

## 2021-01-31 NOTE — Op Note (Signed)
inNAMETACCARA, BUSHNELL MEDICAL RECORD NO: [id number] ACCOUNT NO: [id number] DATE OF BIRTH: Dec 26, 1970 FACILITY: [facility] LOCATION: [facility] PHYSICIAN: Boykin Nearing, MD  Operative Report   PREOPERATIVE DIAGNOSES: 1.  Menorrhagia. 2.  Dyspareunia.  Marland Kitchen  POSTOPERATIVE DIAGNOSES: 1.  Menorrhagia. 2.  Dyspareunia. 3.  Endometriosis. 4.  Left ovarian cyst. 5.  Pelvic adhesions.  PROCEDURE: 1.  Laparoscopic-assisted vaginal hysterectomy. 2.  Bilateral salpingectomy. 3.  Lysis of adhesions. 4.  Left ovarian cystotomy. 5.  Left ovarian endometriosis fulguration.  SURGEON:  Boykin Nearing, MD  ASSISTANT:  Benjaman Kindler, MD  INDICATIONS:  A 50 year old female with a long history of heavy menorrhagia, uncontrolled with conservative treatment.  The patient also with increasing dyspareunia recently.  The patient elects for definitive surgery.  The patient is status post one  vaginal delivery and one cesarean section.  DESCRIPTION OF PROCEDURE:  After adequate general endotracheal anesthesia, the patient was placed in the dorsal supine position with the legs in the Inez stirrups.  The patient's abdomen, perineum and vagina were prepped and draped in normal sterile  fashion.  Timeout was performed.  The patient did receive 2 grams of IV Ancef prior to commencement of the case.  Weighted speculum was placed into the vagina and the anterior cervix was grasped with a single tooth tenaculum and a Kogan endocervical  manipulator was placed and the two were attached together.  Straight catheterization of the bladder yielded 100 mL clear urine.  Gloves were changed, gown was changed and attention was directed to the patient's abdomen where a 5 mm infraumbilical  incision was made after injecting with 0.5% Marcaine.  The laparoscope was advanced under direct visualization.  Once gaining entrance into the patient's abdomen, the patient's abdomen was insufflated with carbon dioxide.  A  second port placement was  placed in the left lower quadrant, 3 cm medial to the left anterior iliac spine and again a 5 mm trocar was advanced under direct visualization.  A third port site was placed in right lower quadrant 3 cm medial to the right anterior iliac spine under  direct visualization.  A 5 mm trocar was advanced into the abdominal cavity.  Attention was directed to the patient's pelvic organs.  There was a powder burn area on the left ovary as well as a 3 x 2 cm domed cyst on the left ovary.  Harmonic scalpel was  brought up to the operative field and a left ovarian cystotomy was performed with clear fluid, resulting.  The ureters were identified bilaterally with normal peristaltic activity.  The infundibulopelvic ligament on the left was cauterized and  transected with the Harmonic scalpel.  The uteroovarian ligament was clamped, cauterized and transected as well as the left ovarian as well as the left round ligament.  The left uterine artery was skeletonized after opening up the bladder flap and  removing the bladder to a cephalad away from the cervix.  The left uterine artery was then cauterized and transected with the Kleppinger cautery and the Harmonic scalpel.  Similar procedure was repeated on the patient's right side.  Again, the right  mesosalpinx was transected and the uteroovarian ligament was cauterized and transected as well as skeletonization of the right uterine artery.  Uterine artery was cauterized with the Kleppinger cautery and the Harmonic scalpel.  Good hemostasis was  noted.  At this point, surgeons went down to the vaginal area to complete the procedure.  The patient's cervix was then circumferentially injected with 1%  lidocaine with 1:100,000 epinephrine.  Short billed weighted speculum was utilized to help with  visualization.  A direct posterior colpotomy incision was made.  Upon entry into the posterior cul-de-sac, the peritoneal edge was tagged to the posterior  vaginal cuff.  A long weighted billed speculum was then placed into the endometrial cavity.  The  left and right uterosacral ligaments were bilaterally clamped, transected and suture ligated with 0 Vicryl suture.  Two additional clamps were placed bilaterally and pedicles were secured with 0 Vicryl suture and the cervix, uterus and fallopian tubes  were removed.  Good hemostasis was noted and the vaginal cuff was then closed with a 0 Vicryl suture in a running nonlocking fashion.  The uterosacral ligaments were plicated centrally and then the rest of the cuff was closed with 0 Vicryl suture.   Attention was then directed back up to the abdomen after gloves and gown were changed.  The patient's abdomen was reinsufflated and the vaginal cuff appeared hemostatic except for two small areas that had a little oozing that was controlled with the  Kleppinger cautery.  Arista was then placed over top.  All pedicles appeared normal, intra-abdominal pressure was lowered and good hemostasis was noted.  The ureters were identified at the end of the case with normal peristaltic activity.  Pictures were  taken.  A small adhesion on the right abdominal sidewall was taken down with Harmonic scalpel.  The patient tolerated the procedure well and was taken to recovery room in good condition.  INTRAOPERATIVE FLUIDS:  1000 mL.  ESTIMATED BLOOD LOSS:  50 mL.  URINE OUTPUT:  100 mL prior to commencement of the case and additional 50 mL at the end of the case, totaling 150 mL.  DISPOSITION:  She was taken to recovery room in good condition.   NIK D: 01/30/2021 5:34:52 pm T: 01/31/2021 12:02:00 am  JOB: [id number]/ [id number]

## 2021-02-03 ENCOUNTER — Other Ambulatory Visit: Payer: BC Managed Care – PPO

## 2021-02-14 ENCOUNTER — Other Ambulatory Visit (HOSPITAL_BASED_OUTPATIENT_CLINIC_OR_DEPARTMENT_OTHER): Payer: Self-pay | Admitting: Obstetrics and Gynecology

## 2021-02-14 ENCOUNTER — Other Ambulatory Visit: Payer: Self-pay | Admitting: Obstetrics and Gynecology

## 2021-02-14 DIAGNOSIS — Z9889 Other specified postprocedural states: Secondary | ICD-10-CM

## 2021-02-14 DIAGNOSIS — R1030 Lower abdominal pain, unspecified: Secondary | ICD-10-CM

## 2021-02-15 ENCOUNTER — Other Ambulatory Visit: Payer: Self-pay

## 2021-02-15 ENCOUNTER — Ambulatory Visit
Admission: RE | Admit: 2021-02-15 | Discharge: 2021-02-15 | Disposition: A | Discharge status: Home / Self Care | Payer: BC Managed Care – PPO | Source: Ambulatory Visit | Attending: Obstetrics and Gynecology | Admitting: Obstetrics and Gynecology

## 2021-02-15 DIAGNOSIS — R1030 Lower abdominal pain, unspecified: Secondary | ICD-10-CM

## 2021-02-15 DIAGNOSIS — Z9889 Other specified postprocedural states: Secondary | ICD-10-CM

## 2021-02-15 MED ORDER — IOHEXOL 300 MG/ML  SOLN
100.0000 mL | Freq: Once | INTRAMUSCULAR | Status: AC | PRN
  Administered 2021-02-15: 100 mL via INTRAVENOUS

## 2021-02-16 ENCOUNTER — Encounter: Payer: Self-pay | Admitting: Obstetrics and Gynecology

## 2021-02-16 ENCOUNTER — Inpatient Hospital Stay
Admission: AD | Admit: 2021-02-16 | Discharge: 2021-02-18 | DRG: 921 | Disposition: A | Discharge status: Home / Self Care | Payer: BC Managed Care – PPO | Attending: Obstetrics and Gynecology | Admitting: Obstetrics and Gynecology

## 2021-02-16 ENCOUNTER — Other Ambulatory Visit: Payer: Self-pay

## 2021-02-16 ENCOUNTER — Other Ambulatory Visit: Payer: Self-pay | Admitting: Obstetrics and Gynecology

## 2021-02-16 DIAGNOSIS — R140 Abdominal distension (gaseous): Secondary | ICD-10-CM | POA: Diagnosis present

## 2021-02-16 DIAGNOSIS — N760 Acute vaginitis: Secondary | ICD-10-CM | POA: Diagnosis present

## 2021-02-16 DIAGNOSIS — Z90710 Acquired absence of both cervix and uterus: Secondary | ICD-10-CM

## 2021-02-16 DIAGNOSIS — M549 Dorsalgia, unspecified: Secondary | ICD-10-CM | POA: Diagnosis present

## 2021-02-16 DIAGNOSIS — N739 Female pelvic inflammatory disease, unspecified: Secondary | ICD-10-CM | POA: Diagnosis present

## 2021-02-16 DIAGNOSIS — Z20822 Contact with and (suspected) exposure to covid-19: Secondary | ICD-10-CM | POA: Diagnosis present

## 2021-02-16 DIAGNOSIS — K651 Peritoneal abscess: Secondary | ICD-10-CM

## 2021-02-16 DIAGNOSIS — N99840 Postprocedural hematoma of a genitourinary system organ or structure following a genitourinary system procedure: Secondary | ICD-10-CM | POA: Diagnosis present

## 2021-02-16 LAB — CBC WITH DIFFERENTIAL/PLATELET
Abs Immature Granulocytes: 0.43 10*3/uL — ABNORMAL HIGH (ref 0.00–0.07)
Basophils Absolute: 0.1 10*3/uL (ref 0.0–0.1)
Basophils Relative: 1 %
Eosinophils Absolute: 0.1 10*3/uL (ref 0.0–0.5)
Eosinophils Relative: 1 %
HCT: 31 % — ABNORMAL LOW (ref 36.0–46.0)
Hemoglobin: 10 g/dL — ABNORMAL LOW (ref 12.0–15.0)
Immature Granulocytes: 5 %
Lymphocytes Relative: 10 %
Lymphs Abs: 0.9 10*3/uL (ref 0.7–4.0)
MCH: 28.3 pg (ref 26.0–34.0)
MCHC: 32.3 g/dL (ref 30.0–36.0)
MCV: 87.8 fL (ref 80.0–100.0)
Monocytes Absolute: 0.4 10*3/uL (ref 0.1–1.0)
Monocytes Relative: 4 %
Neutro Abs: 7.1 10*3/uL (ref 1.7–7.7)
Neutrophils Relative %: 79 %
Platelets: 435 10*3/uL — ABNORMAL HIGH (ref 150–400)
RBC: 3.53 MIL/uL — ABNORMAL LOW (ref 3.87–5.11)
RDW: 12.4 % (ref 11.5–15.5)
WBC: 8.9 10*3/uL (ref 4.0–10.5)
nRBC: 0.2 % (ref 0.0–0.2)

## 2021-02-16 LAB — COMPREHENSIVE METABOLIC PANEL
ALT: 11 U/L (ref 0–44)
AST: 12 U/L — ABNORMAL LOW (ref 15–41)
Albumin: 3.3 g/dL — ABNORMAL LOW (ref 3.5–5.0)
Alkaline Phosphatase: 116 U/L (ref 38–126)
Anion gap: 10 (ref 5–15)
BUN: 8 mg/dL (ref 6–20)
CO2: 28 mmol/L (ref 22–32)
Calcium: 8.9 mg/dL (ref 8.9–10.3)
Chloride: 100 mmol/L (ref 98–111)
Creatinine, Ser: 0.74 mg/dL (ref 0.44–1.00)
GFR, Estimated: >60 mL/min (ref >60)
Glucose, Bld: 94 mg/dL (ref 70–99)
Potassium: 4.1 mmol/L (ref 3.5–5.1)
Sodium: 138 mmol/L (ref 135–145)
Total Bilirubin: 0.5 mg/dL (ref 0.3–1.2)
Total Protein: 7.4 g/dL (ref 6.5–8.1)

## 2021-02-16 LAB — RESP PANEL BY RT-PCR (FLU A&B, COVID) ARPGX2
Influenza A by PCR: NEGATIVE
Influenza B by PCR: NEGATIVE
SARS Coronavirus 2 by RT PCR: NEGATIVE

## 2021-02-16 MED ORDER — ACETAMINOPHEN 500 MG PO TABS: 1000.0000 mg | ORAL_TABLET | Freq: Four times a day (QID) | ORAL | Status: DC | PRN

## 2021-02-16 MED ORDER — PIPERACILLIN-TAZOBACTAM 3.375 G IVPB 30 MIN
3.3750 g | Freq: Once | INTRAVENOUS | Status: AC
  Administered 2021-02-16: 3.375 g via INTRAVENOUS
  Filled 2021-02-16: qty 50

## 2021-02-16 MED ORDER — PRENATAL MULTIVITAMIN CH: 1.0000 | ORAL_TABLET | Freq: Every day | ORAL | Status: DC

## 2021-02-16 MED ORDER — MORPHINE SULFATE (PF) 2 MG/ML IV SOLN: 1.0000 mg | INTRAVENOUS | Status: DC | PRN

## 2021-02-16 MED ORDER — KETOROLAC TROMETHAMINE 30 MG/ML IJ SOLN
30.0000 mg | Freq: Four times a day (QID) | INTRAMUSCULAR | Status: DC
  Administered 2021-02-16 – 2021-02-18 (×7): 30 mg via INTRAVENOUS
  Filled 2021-02-16 (×7): qty 1

## 2021-02-16 MED ORDER — POLYETHYLENE GLYCOL 3350 17 G PO PACK
17.0000 g | PACK | Freq: Every day | ORAL | Status: DC | PRN
  Filled 2021-02-16: qty 1

## 2021-02-16 MED ORDER — KETOROLAC TROMETHAMINE 30 MG/ML IJ SOLN: 30.0000 mg | Freq: Four times a day (QID) | INTRAMUSCULAR | Status: DC

## 2021-02-16 MED ORDER — BISACODYL 5 MG PO TBEC
5.0000 mg | DELAYED_RELEASE_TABLET | Freq: Every day | ORAL | Status: DC | PRN
  Filled 2021-02-16: qty 1

## 2021-02-16 MED ORDER — HYDROCODONE-ACETAMINOPHEN 5-325 MG PO TABS
1.0000 | ORAL_TABLET | Freq: Four times a day (QID) | ORAL | Status: DC | PRN
Start: 2021-02-16 — End: 2021-02-18
  Administered 2021-02-18: 1 via ORAL
  Filled 2021-02-16: qty 1

## 2021-02-16 MED ORDER — ONDANSETRON HCL 4 MG/2ML IJ SOLN: 4.0000 mg | Freq: Four times a day (QID) | INTRAMUSCULAR | Status: DC | PRN

## 2021-02-16 MED ORDER — PIPERACILLIN-TAZOBACTAM 3.375 G IVPB 30 MIN: 3.3750 g | Freq: Four times a day (QID) | INTRAVENOUS | Status: DC

## 2021-02-16 MED ORDER — PIPERACILLIN-TAZOBACTAM 3.375 G IVPB
3.3750 g | Freq: Three times a day (TID) | INTRAVENOUS | Status: DC
  Administered 2021-02-16 – 2021-02-18 (×5): 3.375 g via INTRAVENOUS
  Filled 2021-02-16 (×9): qty 50

## 2021-02-16 MED ORDER — LACTATED RINGERS IV SOLN
INTRAVENOUS | Status: DC
  Administered 2021-02-16: 999 mL via INTRAVENOUS

## 2021-02-16 MED ORDER — ONDANSETRON HCL 4 MG PO TABS: 4.0000 mg | ORAL_TABLET | Freq: Four times a day (QID) | ORAL | Status: DC | PRN

## 2021-02-16 MED ORDER — SODIUM CHLORIDE 0.9 % IV SOLN: INTRAVENOUS | Status: DC | PRN

## 2021-02-16 NOTE — H&P (Deleted)
  The note originally documented on this encounter has been moved the the encounter in which it belongs.  

## 2021-02-16 NOTE — H&P (Signed)
Jennifer Rowland is an 50 y.o. female. 2 weeks postop from [facility] with c/o of back pain and abdominal bloating . Low grade temp ( 100.0) in office 2 days ago  CTscan yesterday:  FINDINGS: Lower chest: No acute abnormality.   Hepatobiliary: No solid liver abnormality is seen. No gallstones, gallbladder wall thickening, or biliary dilatation.   Pancreas: Unremarkable. No pancreatic ductal dilatation or surrounding inflammatory changes.   Spleen: Normal in size without significant abnormality.   Adrenals/Urinary Tract: Adrenal glands are unremarkable. Kidneys are normal, without renal calculi, solid lesion, or hydronephrosis. Bladder is unremarkable.   Stomach/Bowel: Stomach is within normal limits. Appendix appears normal. No evidence of bowel wall thickening, distention, or inflammatory changes.   Vascular/Lymphatic: Aortic atherosclerosis. No enlarged abdominal or pelvic lymph nodes.   Reproductive: Status post hysterectomy. There is a thick-walled, rim enhancing air and fluid collection in the low pelvis, interposed between the rectum and urinary bladder and appearing to communicate with the vaginal cuff (series 2, image 72). The dominant, central component of this collection in the pelvis measures 5.7 x 4.9 cm (series 2, image 64).   Other: No abdominal wall hernia or abnormality. No abdominopelvic ascites.   Musculoskeletal: No acute or significant osseous findings.   IMPRESSION: 1.  Status post hysterectomy.   2. There is a thick-walled, rim enhancing air and fluid collection in the low pelvis, interposed between the rectum and urinary bladder and appearing to communicate with the vaginal cuff. The dominant, central component of this collection in the pelvis measures 5.7 x 4.9 cm. Findings are consistent with postoperative abscess, however the presence or absence of infection is not definitively established by CT.        Pertinent Gynecological History: OB History:  G2, P2   Menstrual History:    Past Medical History:  Diagnosis Date   ADD (attention deficit disorder)    Anxiety    Hypertension    Libido, decreased    Sleep pattern disturbance     Past Surgical History:  Procedure Laterality Date   BREAST BIOPSY Right 2011   CORE W/CLIP - NEG   CESAREAN SECTION  1997   LAPAROSCOPIC VAGINAL HYSTERECTOMY WITH SALPINGECTOMY Bilateral 01/30/2021   Procedure: LAPAROSCOPIC ASSISTED VAGINAL HYSTERECTOMY WITH SALPINGECTOMY;  Surgeon: Quyen Cutsforth, Gwen Her, MD;  Location: [facility] OR[facility];  Service: Gynecology;  Laterality: Bilateral;    Family History  Problem Relation Age of Onset   Heart disease Father    Cancer Maternal Grandmother        ovarian   Prostate cancer Maternal Grandfather    Diabetes Maternal Grandfather    Breast cancer Neg Hx    Kidney disease Neg Hx    Kidney cancer Neg Hx    Bladder Cancer Neg Hx    Colon cancer Neg Hx     Social History:  reports that she has never smoked. She has never used smokeless tobacco. She reports current alcohol use. She reports that she does not use drugs.  Allergies:  Allergies  Allergen Reactions   Percocet [Oxycodone-Acetaminophen] Nausea Only    (Not in a hospital admission)   Review of Systems  There were no vitals taken for this visit. Physical Exam VSS Lungs cta  Cv rrr without murmur Abdomen: mild distention . BS active , non tender , no rebound Pelvic : vaginal bloody-tan d/c  + TTP cuff with fulness on right    CT ABDOMEN PELVIS W CONTRAST  Result Date: 02/16/2021 CLINICAL DATA:  Lower abdominal pain,  recent hysterectomy EXAM: CT ABDOMEN AND PELVIS WITH CONTRAST TECHNIQUE: Multidetector CT imaging of the abdomen and pelvis was performed using the standard protocol following bolus administration of intravenous contrast. CONTRAST:  142mL OMNIPAQUE IOHEXOL 300 MG/ML SOLN, additional oral enteric contrast COMPARISON:  06/08/2016 FINDINGS: Lower chest: No acute abnormality.  Hepatobiliary: No solid liver abnormality is seen. No gallstones, gallbladder wall thickening, or biliary dilatation. Pancreas: Unremarkable. No pancreatic ductal dilatation or surrounding inflammatory changes. Spleen: Normal in size without significant abnormality. Adrenals/Urinary Tract: Adrenal glands are unremarkable. Kidneys are normal, without renal calculi, solid lesion, or hydronephrosis. Bladder is unremarkable. Stomach/Bowel: Stomach is within normal limits. Appendix appears normal. No evidence of bowel wall thickening, distention, or inflammatory changes. Vascular/Lymphatic: Aortic atherosclerosis. No enlarged abdominal or pelvic lymph nodes. Reproductive: Status post hysterectomy. There is a thick-walled, rim enhancing air and fluid collection in the low pelvis, interposed between the rectum and urinary bladder and appearing to communicate with the vaginal cuff (series 2, image 72). The dominant, central component of this collection in the pelvis measures 5.7 x 4.9 cm (series 2, image 64). Other: No abdominal wall hernia or abnormality. No abdominopelvic ascites. Musculoskeletal: No acute or significant osseous findings. IMPRESSION: 1.  Status post hysterectomy. 2. There is a thick-walled, rim enhancing air and fluid collection in the low pelvis, interposed between the rectum and urinary bladder and appearing to communicate with the vaginal cuff. The dominant, central component of this collection in the pelvis measures 5.7 x 4.9 cm. Findings are consistent with postoperative abscess, however the presence or absence of infection is not definitively established by CT. These results will be called to the ordering clinician or representative by the Radiologist Assistant, and communication documented in the PACS or [employer]. Aortic Atherosclerosis (ICD10-I70.0). Electronically Signed   By: Delanna Ahmadi M.D.   On: 02/16/2021 08:42     Wbc 8.9K     Assessment/Plan:  Postoperative vaginal cuff  abscess Clinically stable . Non toxic appearing  Start Zosyn 3.3 gm q 6 hrs  Pain meds written  IR percutaneous drainage of abscess in am . NPO > 2400 I have spoken with pt and husband regarding tx plans . All questions answered  Gwen Her Said Rueb 02/16/2021, 3:09 PM

## 2021-02-17 ENCOUNTER — Inpatient Hospital Stay: Payer: BC Managed Care – PPO

## 2021-02-17 ENCOUNTER — Encounter: Payer: Self-pay | Admitting: Anesthesiology

## 2021-02-17 ENCOUNTER — Encounter
Admission: AD | Disposition: A | Discharge status: Home / Self Care | Payer: Self-pay | Source: Ambulatory Visit | Attending: Obstetrics and Gynecology

## 2021-02-17 LAB — TYPE AND SCREEN
ABO/RH(D): A NEG
Antibody Screen: NEGATIVE

## 2021-02-17 SURGERY — REPAIR, VAGINAL CUFF
Anesthesia: Choice

## 2021-02-17 MED ORDER — MIDAZOLAM HCL 2 MG/2ML IJ SOLN
INTRAMUSCULAR | Status: AC | PRN
  Administered 2021-02-17 (×3): 1 mg via INTRAVENOUS

## 2021-02-17 MED ORDER — FENTANYL CITRATE (PF) 100 MCG/2ML IJ SOLN
INTRAMUSCULAR | Status: AC | PRN
  Administered 2021-02-17 (×3): 50 ug via INTRAVENOUS

## 2021-02-17 SURGICAL SUPPLY — 37 items
BAG URINE DRAIN 2000ML AR STRL (UROLOGICAL SUPPLIES) ×2 IMPLANT
BLADE SURG 15 STRL LF DISP TIS (BLADE) ×1 IMPLANT
BLADE SURG 15 STRL SS (BLADE) ×1
CATH FOLEY 2WAY  5CC 16FR (CATHETERS) ×1
CATH ROBINSON RED A/P 16FR (CATHETERS) ×2 IMPLANT
CATH URTH 16FR FL 2W BLN LF (CATHETERS) ×1 IMPLANT
DRAPE PERI LITHO V/GYN (MISCELLANEOUS) ×2 IMPLANT
DRAPE SURG 17X11 SM STRL (DRAPES) ×2 IMPLANT
DRAPE UNDER BUTTOCK W/FLU (DRAPES) ×2 IMPLANT
ELECT REM PT RETURN 9FT ADLT (ELECTROSURGICAL) ×2
ELECTRODE REM PT RTRN 9FT ADLT (ELECTROSURGICAL) ×1 IMPLANT
GAUZE 4X4 16PLY ~~LOC~~+RFID DBL (SPONGE) ×2 IMPLANT
GAUZE PACK 2X3YD (PACKING) ×2 IMPLANT
GLOVE SURG SYN 8.0 (GLOVE) ×2 IMPLANT
GOWN STRL REUS W/ TWL LRG LVL3 (GOWN DISPOSABLE) ×3 IMPLANT
GOWN STRL REUS W/ TWL XL LVL3 (GOWN DISPOSABLE) ×1 IMPLANT
GOWN STRL REUS W/TWL LRG LVL3 (GOWN DISPOSABLE) ×3
GOWN STRL REUS W/TWL XL LVL3 (GOWN DISPOSABLE) ×1
KIT TURNOVER CYSTO (KITS) ×2 IMPLANT
KIT TURNOVER KIT A (KITS) ×2 IMPLANT
LABEL OR SOLS (LABEL) ×2 IMPLANT
MANIFOLD NEPTUNE II (INSTRUMENTS) ×2 IMPLANT
NEEDLE HYPO 22GX1.5 SAFETY (NEEDLE) ×2 IMPLANT
NS IRRIG 500ML POUR BTL (IV SOLUTION) ×2 IMPLANT
PACK BASIN MINOR ARMC (MISCELLANEOUS) ×2 IMPLANT
PAD OB MATERNITY 4.3X12.25 (PERSONAL CARE ITEMS) ×2 IMPLANT
PAD PREP 24X41 OB/GYN DISP (PERSONAL CARE ITEMS) ×2 IMPLANT
SCRUB EXIDINE 4% CHG 4OZ (MISCELLANEOUS) ×2 IMPLANT
SUT PDS AB 2-0 CT1 27 (SUTURE) ×2 IMPLANT
SUT VIC AB 0 CT1 36 (SUTURE) ×2 IMPLANT
SUT VIC AB 2-0 CT1 36 (SUTURE) IMPLANT
SUT VIC AB 2-0 SH 27 (SUTURE) ×3
SUT VIC AB 2-0 SH 27XBRD (SUTURE) ×3 IMPLANT
SUT VIC AB 3-0 SH 27 (SUTURE)
SUT VIC AB 3-0 SH 27X BRD (SUTURE) IMPLANT
SYR 10ML LL (SYRINGE) ×2 IMPLANT
WATER STERILE IRR 500ML POUR (IV SOLUTION) ×2 IMPLANT

## 2021-02-17 NOTE — Consult Note (Signed)
Chief Complaint: Patient was seen in consultation today for CT guided pelvic drain placement.  Referring Physician(s): Schermerhorn, Marcello Moores  Patient Status: [place] - In-pt  History of Present Illness: Jennifer Rowland is a 50 y.o. female with history of laparoscopic assisted vaginal hysterectomy on 01/30/2021.  Patient presented with back pain and abdominal bloating.  CT of abdomen and pelvis demonstrated a thick-walled fluid collection just above the vaginal cuff and abscess cannot be excluded.  Patient was admitted and started on IV antibiotics.  Patient is feeling better since has been in the hospital and no longer complaining of pain.  Past Medical History:  Diagnosis Date   ADD (attention deficit disorder)    Anxiety    Hypertension    Libido, decreased    Sleep pattern disturbance     Past Surgical History:  Procedure Laterality Date   BREAST BIOPSY Right 2011   CORE W/CLIP - NEG   CESAREAN SECTION  1997   LAPAROSCOPIC VAGINAL HYSTERECTOMY WITH SALPINGECTOMY Bilateral 01/30/2021   Procedure: LAPAROSCOPIC ASSISTED VAGINAL HYSTERECTOMY WITH SALPINGECTOMY;  Surgeon: Schermerhorn, Gwen Her, MD;  Location: [facility] ORS;  Service: Gynecology;  Laterality: Bilateral;    Allergies: Percocet [oxycodone-acetaminophen]  Medications: Prior to Admission medications   Medication Sig Start Date End Date Taking? Authorizing Provider  amLODipine (NORVASC) 5 MG tablet Take 5 mg by mouth at bedtime. 02/16/19   [provider]  amphetamine-dextroamphetamine (ADDERALL) 15 MG tablet Take 1 tablet by mouth 2 (two) times daily. Patient taking differently: Take 15 mg by mouth daily. 11/24/18 01/30/21  Shambley, Melody N, CNM  MOUNJARO 5 MG/0.5ML Pen Inject 5 mg into the skin once a week. 12/21/20   [provider]  Multiple Vitamin (MULTIVITAMIN) capsule Take 1 capsule by mouth daily.    [provider]  Vitamin D, Cholecalciferol, 25 MCG (1000 UT) CAPS Take 1,000 Units by  mouth daily as needed (Vitamin).    [provider]     Family History  Problem Relation Age of Onset   Heart disease Father    Cancer Maternal Grandmother        ovarian   Prostate cancer Maternal Grandfather    Diabetes Maternal Grandfather    Breast cancer Neg Hx    Kidney disease Neg Hx    Kidney cancer Neg Hx    Bladder Cancer Neg Hx    Colon cancer Neg Hx     Social History   Socioeconomic History   Marital status: Married    Spouse name: Not on file   Number of children: Not on file   Years of education: Not on file   Highest education level: Not on file  Occupational History   Not on file  Tobacco Use   Smoking status: Never   Smokeless tobacco: Never  Vaping Use   Vaping Use: Never used  Substance and Sexual Activity   Alcohol use: Yes    Comment: occas   Drug use: No   Sexual activity: Yes    Comment: husband-vasectomy  Other Topics Concern   Not on file  Social History Narrative   Not on file   Social Determinants of Health   Financial Resource Strain: Not on file  Food Insecurity: Not on file  Transportation Needs: Not on file  Physical Activity: Not on file  Stress: Not on file  Social Connections: Not on file     Review of Systems  Constitutional:  Positive for fever.  Gastrointestinal:  Positive for abdominal pain.  Vital Signs: BP (!) 127/93 (BP Location: Left Arm)   Pulse 85   Temp 98.5 F (36.9 C) (Oral)   Resp 12   Ht 5\' 3"  (1.6 m)   Wt 61.7 kg   SpO2 100%   BMI 24.09 kg/m   Physical Exam Constitutional:      General: She is not in acute distress.    Appearance: Normal appearance.  Cardiovascular:     Rate and Rhythm: Normal rate and regular rhythm.  Pulmonary:     Effort: Pulmonary effort is normal.     Breath sounds: Normal breath sounds.  Abdominal:     General: Abdomen is flat.     Palpations: Abdomen is soft.  Neurological:     Mental Status: She is alert.    Imaging: CT ABDOMEN PELVIS W  CONTRAST  Result Date: 02/16/2021 CLINICAL DATA:  Lower abdominal pain, recent hysterectomy EXAM: CT ABDOMEN AND PELVIS WITH CONTRAST TECHNIQUE: Multidetector CT imaging of the abdomen and pelvis was performed using the standard protocol following bolus administration of intravenous contrast. CONTRAST:  190mL OMNIPAQUE IOHEXOL 300 MG/ML SOLN, additional oral enteric contrast COMPARISON:  06/08/2016 FINDINGS: Lower chest: No acute abnormality. Hepatobiliary: No solid liver abnormality is seen. No gallstones, gallbladder wall thickening, or biliary dilatation. Pancreas: Unremarkable. No pancreatic ductal dilatation or surrounding inflammatory changes. Spleen: Normal in size without significant abnormality. Adrenals/Urinary Tract: Adrenal glands are unremarkable. Kidneys are normal, without renal calculi, solid lesion, or hydronephrosis. Bladder is unremarkable. Stomach/Bowel: Stomach is within normal limits. Appendix appears normal. No evidence of bowel wall thickening, distention, or inflammatory changes. Vascular/Lymphatic: Aortic atherosclerosis. No enlarged abdominal or pelvic lymph nodes. Reproductive: Status post hysterectomy. There is a thick-walled, rim enhancing air and fluid collection in the low pelvis, interposed between the rectum and urinary bladder and appearing to communicate with the vaginal cuff (series 2, image 72). The dominant, central component of this collection in the pelvis measures 5.7 x 4.9 cm (series 2, image 64). Other: No abdominal wall hernia or abnormality. No abdominopelvic ascites. Musculoskeletal: No acute or significant osseous findings. IMPRESSION: 1.  Status post hysterectomy. 2. There is a thick-walled, rim enhancing air and fluid collection in the low pelvis, interposed between the rectum and urinary bladder and appearing to communicate with the vaginal cuff. The dominant, central component of this collection in the pelvis measures 5.7 x 4.9 cm. Findings are consistent with  postoperative abscess, however the presence or absence of infection is not definitively established by CT. These results will be called to the ordering clinician or representative by the Radiologist Assistant, and communication documented in the PACS or [employer]. Aortic Atherosclerosis (ICD10-I70.0). Electronically Signed   By: Delanna Ahmadi M.D.   On: 02/16/2021 08:42    Labs:  CBC: Recent Labs    01/23/21 0903 02/16/21 1545  WBC 5.4 8.9  HGB 13.8 10.0*  HCT 41.2 31.0*  PLT 258 435*    COAGS: No results for input(s): INR, APTT in the last 8760 hours.  BMP: Recent Labs    01/23/21 0903 02/16/21 1545  NA 138 138  K 3.6 4.1  CL 106 100  CO2 26 28  GLUCOSE 78 94  BUN 15 8  CALCIUM 8.8* 8.9  CREATININE 0.76 0.74  GFRNONAA >60 >60    LIVER FUNCTION TESTS: Recent Labs    02/16/21 1545  BILITOT 0.5  AST 12*  ALT 11  ALKPHOS 116  PROT 7.4  ALBUMIN 3.3*    TUMOR MARKERS: No results for  input(s): AFPTM, CEA, CA199, CHROMGRNA in the last 8760 hours.  Assessment and Plan:  50 year old with postoperative fluid collection in pelvis following laparoscopic assisted hysterectomy on 01/30/2021.  I reviewed the CT images and suspect the fluid collection has a hematoma component. There is some gas within the collection and in the vagina.  Difficult exclude an infected hematoma.  Location of the fluid collection makes it difficult for percutaneous access but there appears to be a small percutaneous window from a transgluteal approach.  CT-guided transgluteal aspiration/drainage was discussed with the patient and family.  Risks and benefits discussed with the patient including bleeding, infection, damage to adjacent structures, bowel perforation/fistula connection, and sepsis.  All of the patient's questions were answered, patient is agreeable to proceed. Consent signed and in chart.   Thank you for this interesting consult.  I greatly enjoyed meeting Jennifer Rowland and  look forward to participating in their care.  A copy of this report was sent to the requesting provider on this date.  Electronically Signed: Burman Riis, MD 02/17/2021, 3:34 PM   I spent a total of 10 minutes  in face to face in clinical consultation, greater than 50% of which was counseling/coordinating care for pelvic fluid collection.

## 2021-02-17 NOTE — Progress Notes (Signed)
Day of Surgery Procedure(s) (LRB): REPAIR VAGINAL CUFF (N/A)  Pt HD #2 for vaginal cuff abscess  Started on Zosyn yesterday Iv q 6 hrs .  Pt underwent a CT guided percutaneous partial drainage of cyst :  Patient was placed prone on the CT scanner. CT images through the pelvis were obtained. Again noted is a complex fluid collection above the vaginal cuff. Left transgluteal percutaneous window was identified. The left buttock was prepped with chlorhexidine and sterile field was created. Maximal barrier sterile technique was utilized including caps, mask, sterile gowns, sterile gloves, sterile drape, hand hygiene and skin antiseptic. Skin was anesthetized using 1% lidocaine. Small incision was made. Using CT guidance, an 18 gauge trocar needle was directed into the fluid collection. Initially, no fluid could be aspirated at all. Eventually, a few drops of very dark bloody fluid was aspirated. Superstiff Amplatz wire was advanced into the collection. Tried to aspirate the collection with a Yueh catheter over the wire but the Yueh catheter was too short. The tract was dilated over the wire and a 10 [place] multipurpose drain was advanced over the wire. It was very difficult to advance the drain over the wire and it was very painful for the patient. Approximately 3-4 ml of additional dark bloody fluid was aspirated. Follow up CT images demonstrated that some of the drain side holes were not within the collection. Based on the drain position, patient discomfort and consistency of the aspirated fluid, I felt that the drain would provide minimal benefit in the future. Therefore, the drain was completely removed. Bandage was placed at the drain site.   FINDINGS: Complex fluid collection above the vagina containing a small amount of gas. Hounsfield units are greater than 30 and fluid collection was suspicious for a complex postoperative hematoma. Needle was successfully advanced into the  collection from a left transgluteal approach but only a small amount of dark bloody fluid could be obtained. Even after a 10 [place] drain was placed, only a few mL of dark bloody fluid could be removed. Findings are most compatible with a postoperative hematoma.    Subjective: Patient reports no significant change in pain but she is partially sedated from the procedure that recently ended .   + hungry  Objective:vss   Afebrile  I have reviewed patient's vital signs, medications, labs, and radiology results.  GI: soft, non-tender; bowel sounds normal; no masses,  no organomegaly  Assessment:/Plan: By drainage for ct procedure the cuff cyst looks more c/w a hematoma  Cultures pending  I will continue abx IV and repeat CBc in am  If still afebrile and nl WBC I will d/c home on Po abx    Gwen Her Librado Guandique 02/17/2021, 5:24 PM

## 2021-02-17 NOTE — Procedures (Signed)
Interventional Radiology Procedure:   Indications: Post operative pelvic fluid collection, need to rule out abscess  Procedure: CT guided pelvic drain placement.  Drain was removed at end of procedure.  Findings: Complex pelvic fluid collection with gas.  Needle placed within collection and only a few drops of dark blood aspirated.  As a result, 10 Fr drain was placed and additional 3-4 ml of dark bloody fluid removed.  Findings most compatible with a hematoma.  Drain was not well positioned in the collection due to small size and the drain was very uncomfortable for the patient.  I did not feel like the drain would add much more benefit due to the complex nature of the pelvic collection, therefore, the drain was completely removed.  Fluid was sent for culture.   Complications: No immediate complications noted.     EBL: Minimal  Plan: Send fluid for culture.     Kevonte Vanecek R. Anselm Pancoast, MD  Pager: [phone]

## 2021-02-18 LAB — CBC
HCT: 29.2 % — ABNORMAL LOW (ref 36.0–46.0)
Hemoglobin: 9.5 g/dL — ABNORMAL LOW (ref 12.0–15.0)
MCH: 28.9 pg (ref 26.0–34.0)
MCHC: 32.5 g/dL (ref 30.0–36.0)
MCV: 88.8 fL (ref 80.0–100.0)
Platelets: 348 10*3/uL (ref 150–400)
RBC: 3.29 MIL/uL — ABNORMAL LOW (ref 3.87–5.11)
RDW: 12.5 % (ref 11.5–15.5)
WBC: 7 10*3/uL (ref 4.0–10.5)
nRBC: 0 % (ref 0.0–0.2)

## 2021-02-18 MED ORDER — AMOXICILLIN-POT CLAVULANATE 875-125 MG PO TABS: 1.0000 | ORAL_TABLET | Freq: Two times a day (BID) | ORAL | 0 refills | Status: AC

## 2021-02-18 MED ORDER — FLUCONAZOLE 150 MG PO TABS: 150.0000 mg | ORAL_TABLET | ORAL | 0 refills | Status: AC

## 2021-02-18 NOTE — Discharge Summary (Signed)
Physician Discharge Summary  Patient ID: Jennifer Rowland MRN: [id number] DOB/AGE: Sep 07, 1970 50 y.o.  Admit date: 02/16/2021 Discharge date: 02/18/2021  Admission Diagnoses:posthysterectomy vaginal cuff abscess  Discharge Diagnoses: posthysterctomy vaginal cuff hematoma Active Problems:   Pelvic abscess in female   Discharged Condition: good  Hospital Course: pt was admitted and placed on zosyn  q 6 hr iv . She remained afebrile and had a normal WBC . HD#2 she underwent a CT guided percutaneous aspiration of the cyst . Appearing to be more consistent with a hematoma . Drain was not placed . HD#3 clinically improving .   Consults:  IR   Significant Diagnostic Studies: microbiology: wound culture: moderate gram + Cocci , culture pending   Treatments: IV hydration and antibiotics: Zosyn  Discharge Exam: Blood pressure 122/72, pulse 76, temperature 98.4 F (36.9 C), temperature source Oral, resp. rate 18, height 5\' 3"  (1.6 m), weight 61.7 kg, SpO2 100 %. Head: Normocephalic, without obvious abnormality, atraumatic Resp: clear to auscultation bilaterally Cardio: regular rate and rhythm, S1, S2 normal, no murmur, click, rub or gallop GI: soft, non-tender; bowel sounds normal; no masses,  no organomegaly  Disposition: Discharge disposition: 01-Home or Self Care       Discharge Instructions     Call MD for:   Complete by: As directed    Nausea/ vomiting , increasing abdominal bloating   Call MD for:  difficulty breathing, headache or visual disturbances   Complete by: As directed    Call MD for:  extreme fatigue   Complete by: As directed    Call MD for:  hives   Complete by: As directed    Call MD for:  persistant dizziness or light-headedness   Complete by: As directed    Call MD for:  persistant nausea and vomiting   Complete by: As directed    Call MD for:  redness, tenderness, or signs of infection (pain, swelling, redness, odor or green/yellow discharge around  incision site)   Complete by: As directed    Call MD for:  severe uncontrolled pain   Complete by: As directed    Call MD for:  temperature >100.4   Complete by: As directed    Diet - low sodium heart healthy   Complete by: As directed    Increase activity slowly   Complete by: As directed    No wound care   Complete by: As directed         Follow-up Information     Severin Bou, Gwen Her, MD Follow up on 02/27/2021.   Specialty: Obstetrics and Gynecology Why: already scheduled Contact information: [address] [place] [address] [phone]                 Signed: Gwen Her Lewayne Pauley 02/18/2021, 11:29 AM

## 2021-02-18 NOTE — Progress Notes (Signed)
Patient discharged home with family.  Discharge instructions, when to follow up, and prescriptions reviewed with patient.  Patient verbalized understanding. Patient will be escorted out by auxiliary.   

## 2021-02-18 NOTE — Progress Notes (Signed)
1 Day Post-Op Procedure(s) (LRB):HD 33 , percutaneous drainage of cuff abscess by IR yesterday . Pt feels better . No fever. Less bloated .Some buttock pain were the procedure was done yesterday .  Subjective: Patient reports tolerating PO.   Pain less, less bloating  Objective: I have reviewed patient's vital signs, intake and output, medications, labs, and microbiology.  General: alert and cooperative Resp: clear to auscultation bilaterally Cardio: regular rate and rhythm, S1, S2 normal, no murmur, click, rub or gallop GI: soft, non-tender; bowel sounds normal; no masses,  no organomegaly  Assessment: s/p   vaginal cuff hematoma , possible abscess given aspirate cocci obtained yesterday . Clinically stable .  Plan: Discharge home 14 days of po Augmentin 875/125mg  bid   Add Difucan 150 po in one week and repeat in 2 weeks  RTC in 8 days  Precautions given to her   LOS: 2 days    Gwen Her Nateisha Moyd 02/18/2021, 11:17 AM

## 2021-02-22 LAB — AEROBIC/ANAEROBIC CULTURE W GRAM STAIN (SURGICAL/DEEP WOUND): Gram Stain: NONE SEEN

## 2021-12-15 ENCOUNTER — Other Ambulatory Visit: Payer: Self-pay | Admitting: Internal Medicine

## 2021-12-15 DIAGNOSIS — Z1231 Encounter for screening mammogram for malignant neoplasm of breast: Secondary | ICD-10-CM

## 2022-01-12 ENCOUNTER — Ambulatory Visit
Admission: RE | Admit: 2022-01-12 | Discharge: 2022-01-12 | Disposition: A | Discharge status: Home / Self Care | Payer: BC Managed Care – PPO | Source: Ambulatory Visit | Attending: Internal Medicine | Admitting: Internal Medicine

## 2022-01-12 DIAGNOSIS — Z1231 Encounter for screening mammogram for malignant neoplasm of breast: Secondary | ICD-10-CM | POA: Insufficient documentation

## 2022-12-19 ENCOUNTER — Other Ambulatory Visit: Payer: Self-pay | Admitting: Internal Medicine

## 2022-12-19 DIAGNOSIS — Z1231 Encounter for screening mammogram for malignant neoplasm of breast: Secondary | ICD-10-CM

## 2023-01-04 ENCOUNTER — Ambulatory Visit: Payer: 59

## 2023-01-04 DIAGNOSIS — Z1211 Encounter for screening for malignant neoplasm of colon: Secondary | ICD-10-CM | POA: Diagnosis present

## 2023-01-04 DIAGNOSIS — K640 First degree hemorrhoids: Secondary | ICD-10-CM | POA: Diagnosis not present

## 2023-01-04 LAB — HM COLONOSCOPY

## 2023-01-16 ENCOUNTER — Ambulatory Visit
Admission: RE | Admit: 2023-01-16 | Discharge: 2023-01-16 | Disposition: A | Discharge status: Home / Self Care | Payer: Managed Care, Other (non HMO) | Source: Ambulatory Visit | Attending: Internal Medicine | Admitting: Internal Medicine

## 2023-01-16 DIAGNOSIS — Z1231 Encounter for screening mammogram for malignant neoplasm of breast: Secondary | ICD-10-CM | POA: Insufficient documentation

## 2023-03-21 ENCOUNTER — Ambulatory Visit (INDEPENDENT_AMBULATORY_CARE_PROVIDER_SITE_OTHER): Payer: Managed Care, Other (non HMO) | Admitting: Family

## 2023-03-21 ENCOUNTER — Ambulatory Visit: Payer: Managed Care, Other (non HMO) | Admitting: Family

## 2023-03-21 ENCOUNTER — Encounter: Payer: Self-pay | Admitting: Family

## 2023-03-21 VITALS — BP 142/80 | HR 87 | Temp 98.0°F | Ht 63.0 in | Wt 162.2 lb

## 2023-03-21 DIAGNOSIS — I10 Essential (primary) hypertension: Secondary | ICD-10-CM | POA: Diagnosis not present

## 2023-03-21 DIAGNOSIS — Z79890 Hormone replacement therapy (postmenopausal): Secondary | ICD-10-CM | POA: Diagnosis not present

## 2023-03-21 DIAGNOSIS — Z124 Encounter for screening for malignant neoplasm of cervix: Secondary | ICD-10-CM

## 2023-03-21 DIAGNOSIS — E663 Overweight: Secondary | ICD-10-CM | POA: Diagnosis not present

## 2023-03-21 MED ORDER — ESTRADIOL 1 MG PO TABS: 1.0000 mg | ORAL_TABLET | Freq: Every day | ORAL | Status: AC

## 2023-03-21 MED ORDER — TIRZEPATIDE 2.5 MG/0.5ML ~~LOC~~ SOAJ: 2.5000 mg | SUBCUTANEOUS | 1 refills | Status: DC

## 2023-03-21 NOTE — Assessment & Plan Note (Addendum)
Counseled on risk of long-term use of HRT.  Advised short course of HRT, lowest dose.  We will discuss decreasing dose in the next year.

## 2023-03-21 NOTE — Patient Instructions (Addendum)
It is imperative that you are seen AT least twice per year for labs and monitoring. Monitor blood pressure at home and me 5-6 reading on separate days. Goal is less than 120/80, based on newest guidelines, however we certainly want to be less than 130/80;  if persistently higher, please make sooner follow up appointment so we can recheck you blood pressure and manage/ adjust medications.  If Jennifer Rowland is not covered through your insurance, you may go to the [email or web address] to complete information for savings card.   You may also use the link below.   [email or web address]  You may take this savings to [facility], [facility] or [facility]. If you are unable to get mounjaro, please call to schedule an appointment with me so we can discuss alternative weight loss medications.   start Mounjaro 2.5mg  once per week injected subcutaneously ( [place])  in stomach. Please clean with alcohol swab prior to injection and be sure to rotate site. You may schedule a nurse visit if you would like to first injection.   After 4 weeks, and if tolerated and weight loss has not reached 1-2 lbs per week, please increase to 5mg  once per week [place].    Please read information on medication below and remember black box warning that you may not take if you or a family member is diagnosed with thyroid cancer (medullary thyroid cancer), or multiple endocrine neoplasia.       Tirzepatide Injection Jennifer Rowland) What is this medication? TIRZEPATIDE (tir ZEP a tide) treats type 2 diabetes. It works by increasing insulin levels in your body, which decreases your blood sugar (glucose). Changes to diet and exercise are often combined with this medication. This medicine may be used for other purposes; ask your health care provider or pharmacist if you have questions. COMMON BRAND NAME(S): MOUNJARO What should I tell my care team before I take this medication? They need to know if you have any  of these conditions: Endocrine tumors (MEN 2) or if someone in your family had these tumors Eye disease, vision problems Gallbladder disease History of pancreatitis Kidney disease Stomach or intestine problems Thyroid cancer or if someone in your family had thyroid cancer An unusual or allergic reaction to tirzepatide, other medications, foods, dyes, or preservatives Pregnant or trying to get pregnant Breast-feeding How should I use this medication? This medication is injected under the skin. You will be taught how to prepare and give it. It is given once every week (every 7 days). Keep taking it unless your health care provider tells you to stop. If you use this medication with insulin, you should inject this medication and the insulin separately. Do not mix them together. Do not give the injections right next to each other. Change (rotate) injection sites with each injection. This medication comes with INSTRUCTIONS FOR USE. Ask your pharmacist for directions on how to use this medication. Read the information carefully. Talk to your pharmacist or care team if you have questions. It is important that you put your used needles and syringes in a special sharps container. Do not put them in a trash can. If you do not have a sharps container, call your pharmacist or care team to get one. A special MedGuide will be given to you by the pharmacist with each prescription and refill. Be sure to read this information carefully each time. Talk to your care team about the use of this medication in children. Special care may be needed. Overdosage: If you think  you have taken too much of this medicine contact a poison control center or emergency room at once. NOTE: This medicine is only for you. Do not share this medicine with others. What if I miss a dose? If you miss a dose, take it as soon as you can unless it is more than 4 days (96 hours) late. If it is more than 4 days late, skip the missed dose. Take  the next dose at the normal time. Do not take 2 doses within 3 days of each other. What may interact with this medication? Alcohol containing beverages Antiviral medications for HIV or AIDS Aspirin and aspirin-like medications Beta-blockers like atenolol, metoprolol, propranolol Certain medications for blood pressure, heart disease, irregular heart beat Chromium Clonidine Diuretics Female hormones, such as estrogens or progestins, birth control pills Fenofibrate Gemfibrozil Guanethidine Isoniazid Lanreotide Female hormones or anabolic steroids MAOIs like Carbex, Eldepryl, Marplan, Nardil, and Parnate Medications for weight loss Medications for allergies, asthma, cold, or cough Medications for depression, anxiety, or psychotic disturbances Niacin Nicotine NSAIDs, medications for pain and inflammation, like ibuprofen or naproxen Octreotide Other medications for diabetes, like glyburide, glipizide, or glimepiride Pasireotide Pentamidine Phenytoin Probenecid Quinolone antibiotics such as ciprofloxacin, levofloxacin, ofloxacin Reserpine Some herbal dietary supplements Steroid medications such as prednisone or cortisone Sulfamethoxazole; trimethoprim Thyroid hormones Warfarin This list may not describe all possible interactions. Give your health care provider a list of all the medicines, herbs, non-prescription drugs, or dietary supplements you use. Also tell them if you smoke, drink alcohol, or use illegal drugs. Some items may interact with your medicine. What should I watch for while using this medication? Visit your care team for regular checks on your progress. Drink plenty of fluids while taking this medication. Check with your care team if you get an attack of severe diarrhea, nausea, and vomiting. The loss of too much body fluid can make it dangerous for you to take this medication. A test called the HbA1C (A1C) will be monitored. This is a simple blood test. It measures your  blood sugar control over the last 2 to 3 months. You will receive this test every 3 to 6 months. Learn how to check your blood sugar. Learn the symptoms of low and high blood sugar and how to manage them. Always carry a quick-source of sugar with you in case you have symptoms of low blood sugar. Examples include hard sugar candy or glucose tablets. Make sure others know that you can choke if you eat or drink when you develop serious symptoms of low blood sugar, such as seizures or unconsciousness. They must get medical help at once. Tell your care team if you have high blood sugar. You might need to change the dose of your medication. If you are sick or exercising more than usual, you might need to change the dose of your medication. Do not skip meals. Ask your care team if you should avoid alcohol. Many nonprescription cough and cold products contain sugar or alcohol. These can affect blood sugar. Pens should never be shared. Even if the needle is changed, sharing may result in passing of viruses like hepatitis or HIV. Wear a medical ID bracelet or chain, and carry a card that describes your disease and details of your medication and dosage times. Birth control may not work properly while you are taking this medication. If you take birth control pills by mouth, your care team may recommend another type of birth control for 4 weeks after you start this medication  and for 4 weeks after each increase in your dose of this medication. Ask your care team which birth control methods you should use. What side effects may I notice from receiving this medication? Side effects that you should report to your care team as soon as possible: Allergic reactions-skin rash, itching, hives, swelling of the face, lips, tongue, or throat Change in vision Dehydration-increased thirst, dry mouth, feeling faint or lightheaded, headache, dark yellow or brown urine Gallbladder problems-severe stomach pain, nausea, vomiting,  fever Kidney injury-decrease in the amount of urine, swelling of the ankles, hands, or feet Pancreatitis-severe stomach pain that spreads to your back or gets worse after eating or when touched, fever, nausea, vomiting Thyroid cancer-new mass or lump in the neck, pain or trouble swallowing, trouble breathing, hoarseness Side effects that usually do not require medical attention (report these to your care team if they continue or are bothersome): Constipation Diarrhea Loss of Appetite Nausea Stomach pain Upset stomach Vomiting This list may not describe all possible side effects. Call your doctor for medical advice about side effects. You may report side effects to FDA at 1-800-FDA-1088. Where should I keep my medication? Keep out of the reach of children and pets. Refrigeration (preferred): Store unopened pens in a refrigerator between 2 and 8 degrees C (36 and 46 degrees F). Keep it in the original carton until you are ready to take it. Do not freeze or use if the medication has been frozen. Protect from light. Get rid of any unused medication after the expiration date on the label. Room Temperature: The pen may be stored at room temperature below 30 degrees C (86 degrees F) for up to a total of 21 days if needed. Protect from light. Avoid exposure to extreme heat. If it is stored at room temperature, throw away any unused medication after 21 days or after it expires, whichever is first. The pen has glass parts. Handle it carefully. If you drop the pen on a hard surface, do not use it. Use a new pen for your injection. To get rid of medications that are no longer needed or have expired: Take the medication to a medication take-back program. Check with your pharmacy or law enforcement to find a location. If you cannot return the medication, ask your pharmacist or care team how to get rid of this medication safely. NOTE: This sheet is a summary. It may not cover all possible information. If you  have questions about this medicine, talk to your doctor, pharmacist, or health care provider.  2022 Elsevier/Gold Standard (2020-08-30 13:57:48)

## 2023-03-21 NOTE — Progress Notes (Signed)
Assessment & Plan:  Overweight (BMI 25.0-29.9) Assessment & Plan: BMI greater than 27 with 1 comorbidity, hypertension.  Patient has previously done quite well on [facility] and request restartng medication again today.  Counseled on blackbox warning as it relates to multiple endocrine neoplasia, medullary thyroid cancer, side effects and titration.  Orders: -     Tirzepatide; Inject 2.5 mg into the skin once a week.  Dispense: 2 mL; Refill: 1  Cervical cancer screening  Hormone replacement therapy (HRT) Assessment & Plan: Counseled on risk of long-term use of HRT.  Advised short course of HRT, lowest dose.  We will discuss decreasing dose in the next year.    Orders: -     Estradiol; Take 1 tablet (1 mg total) by mouth daily.  Hypertension, unspecified type Assessment & Plan: Elevated today.  Discussed blood pressure goal closer to 130/80 to 120/80.  Patient will monitor blood pressure at home and if persistently greater then 130, advised her to let me know so I may adjust amlodipine 5 mg.  She is pursuing weight loss at this time      Return precautions given.   Risks, benefits, and alternatives of the medications and treatment plan prescribed today were discussed, and patient expressed understanding.   Education regarding symptom management and diagnosis given to patient on AVS either electronically or printed.  Return in about 6 months (around 09/19/2023).  [name], FNP  Subjective:    Patient ID: Jennifer Rowland, female    DOB: 1971/01/30, 52 y.o.   MRN: [id number]  CC: Jennifer Rowland is a 52 y.o. female who presents today to establish care.    HPI: Concerned with weight gain   she has been using mounjaro for a year. She did well  medication and lost 25lbs.  She recently tried phentermine which was not effective.   Compliant with amlodipine 5mg  every day.   BP at home 143/73.   Denies CP, sob    Compliant with Estrace 1 mg started a year ago after  hysterectomy for vasomotor symptoms.  She is doing well on medication it has been very helpful for hot flashes  No family history of breast cancer. Allergies: Percocet [oxycodone-acetaminophen] Current Outpatient Medications on File Prior to Visit  Medication Sig Dispense Refill   amLODipine (NORVASC) 5 MG tablet Take 5 mg by mouth at bedtime.     Multiple Vitamin (MULTIVITAMIN) capsule Take 1 capsule by mouth daily.     Vitamin D, Cholecalciferol, 25 MCG (1000 UT) CAPS Take 1,000 Units by mouth daily as needed (Vitamin).     amphetamine-dextroamphetamine (ADDERALL) 15 MG tablet Take 1 tablet by mouth 2 (two) times daily. (Patient taking differently: Take 15 mg by mouth daily.) 60 tablet 0   No current facility-administered medications on file prior to visit.    Review of Systems  Constitutional:  Negative for chills and fever.  Respiratory:  Negative for cough.   Cardiovascular:  Negative for chest pain and palpitations.  Gastrointestinal:  Negative for nausea and vomiting.      Objective:    BP (!) 142/80   Pulse 87   Temp 98 F (36.7 C) (Oral)   Ht 5\' 3"  (1.6 m)   Wt 162 lb 3.2 oz (73.6 kg)   LMP 12/19/2020 (Approximate)   SpO2 97%   BMI 28.73 kg/m  BP Readings from Last 3 Encounters:  03/21/23 (!) 142/80  02/18/21 122/72  01/30/21 (!) 166/60   Wt Readings from Last 3  Encounters:  03/21/23 162 lb 3.2 oz (73.6 kg)  02/16/21 136 lb (61.7 kg)  01/30/21 132 lb 0.9 oz (59.9 kg)    Physical Exam Vitals reviewed.  Constitutional:      Appearance: She is well-developed.  Eyes:     Conjunctiva/sclera: Conjunctivae normal.  Neck:     Thyroid: No thyroid mass or thyromegaly.  Cardiovascular:     Rate and Rhythm: Normal rate and regular rhythm.     Pulses: Normal pulses.     Heart sounds: Normal heart sounds.  Pulmonary:     Effort: Pulmonary effort is normal.     Breath sounds: Normal breath sounds. No wheezing, rhonchi or rales.  Lymphadenopathy:     Head:      Right side of head: No submental, submandibular, tonsillar, preauricular, posterior auricular or occipital adenopathy.     Left side of head: No submental, submandibular, tonsillar, preauricular, posterior auricular or occipital adenopathy.     Cervical: No cervical adenopathy.  Skin:    General: Skin is warm and dry.  Neurological:     Mental Status: She is alert.  Psychiatric:        Speech: Speech normal.        Behavior: Behavior normal.        Thought Content: Thought content normal.

## 2023-03-22 ENCOUNTER — Encounter: Payer: Self-pay | Admitting: Family

## 2023-03-22 DIAGNOSIS — I10 Essential (primary) hypertension: Secondary | ICD-10-CM | POA: Insufficient documentation

## 2023-03-22 NOTE — Assessment & Plan Note (Signed)
Elevated today.  Discussed blood pressure goal closer to 130/80 to 120/80.  Patient will monitor blood pressure at home and if persistently greater then 130, advised her to let me know so I may adjust amlodipine 5 mg.  She is pursuing weight loss at this time

## 2023-03-22 NOTE — Assessment & Plan Note (Signed)
BMI greater than 27 with 1 comorbidity, hypertension.  Patient has previously done quite well on [facility] and request restartng medication again today.  Counseled on blackbox warning as it relates to multiple endocrine neoplasia, medullary thyroid cancer, side effects and titration.

## 2023-03-25 ENCOUNTER — Telehealth: Payer: Self-pay | Admitting: Family

## 2023-03-25 NOTE — Telephone Encounter (Signed)
Pt called about PA for [facility]

## 2023-03-27 ENCOUNTER — Encounter: Payer: Self-pay | Admitting: Family

## 2023-03-27 ENCOUNTER — Telehealth: Payer: Self-pay

## 2023-03-27 NOTE — Telephone Encounter (Signed)
p 

## 2023-03-28 ENCOUNTER — Other Ambulatory Visit: Payer: Self-pay | Admitting: Family

## 2023-03-28 ENCOUNTER — Telehealth: Payer: Self-pay

## 2023-03-28 DIAGNOSIS — E663 Overweight: Secondary | ICD-10-CM

## 2023-03-28 MED ORDER — TIRZEPATIDE-WEIGHT MANAGEMENT 2.5 MG/0.5ML ~~LOC~~ SOLN: 2.5000 mg | SUBCUTANEOUS | 2 refills | Status: DC

## 2023-03-28 NOTE — Telephone Encounter (Signed)
Spoke to pt and informed her of message below regarding PA not being submitted, pt states that if you have other suggestions she would like to discuss with you

## 2023-03-28 NOTE — Telephone Encounter (Signed)
Pharmacy Patient Advocate Encounter   Received notification from Pt Calls Messages that prior authorization for [facility] is required/requested.     Jennifer Rowland is only approved for type 2 diabetes. No indication of diabetes on patient's chart. PA not submitted.

## 2023-03-28 NOTE — Telephone Encounter (Signed)
Jennifer Rowland is only approved for type 2 diabetes. No indication of diabetes on patient's chart. PA not submitted.

## 2023-04-01 ENCOUNTER — Telehealth: Payer: Self-pay

## 2023-04-01 ENCOUNTER — Other Ambulatory Visit (HOSPITAL_COMMUNITY): Payer: Self-pay

## 2023-04-01 NOTE — Telephone Encounter (Signed)
PA request has been Submitted. New Encounter created for follow up. For additional info see Pharmacy Prior Auth telephone encounter from 04/01/23.

## 2023-04-01 NOTE — Telephone Encounter (Signed)
Pt has been notified and would like to know if you can prescribe something else because she is not diabetic

## 2023-04-01 NOTE — Telephone Encounter (Signed)
Pharmacy Patient Advocate Encounter   Received notification from Pt Calls Messages that prior authorization for Zepbound 2.5MG /0.5ML pen-injectors is required/requested.   Insurance verification completed.   The patient is insured through [employer] .   Per test claim: PA required and submitted KEY/EOC/Request #: [id number] CANCELLED due to: Drug is not covered by plan

## 2023-04-02 NOTE — Telephone Encounter (Signed)
Pt has been notified.

## 2023-04-02 NOTE — Telephone Encounter (Signed)
Spoke to pt scheduled mcvv 04/04/23

## 2023-04-04 ENCOUNTER — Encounter: Payer: Self-pay | Admitting: Family

## 2023-04-04 ENCOUNTER — Telehealth: Payer: Managed Care, Other (non HMO) | Admitting: Family

## 2023-04-04 VITALS — BP 137/77 | HR 92 | Ht 63.0 in | Wt 160.0 lb

## 2023-04-04 DIAGNOSIS — E663 Overweight: Secondary | ICD-10-CM | POA: Diagnosis not present

## 2023-04-04 DIAGNOSIS — I10 Essential (primary) hypertension: Secondary | ICD-10-CM | POA: Diagnosis not present

## 2023-04-04 MED ORDER — AMLODIPINE BESYLATE 2.5 MG PO TABS: 2.5000 mg | ORAL_TABLET | Freq: Every day | ORAL | 3 refills | Status: DC

## 2023-04-04 MED ORDER — METFORMIN HCL ER 500 MG PO TB24: 500.0000 mg | ORAL_TABLET | Freq: Every evening | ORAL | 2 refills | Status: DC

## 2023-04-04 NOTE — Patient Instructions (Addendum)
[email or web address]  Increase amlodipine to 7.5mg  every day  Metformin is used in prediabetes, diabetes, and also for weight loss by decreasing calorie consumption.   It works in a couple of ways by decreasing liver glucose production, decreases intestinal absorption of glucose and improves insulin sensitivity (increases peripheral glucose uptake and utilization).     Please make sure that you titrate per below so not to cause any GI upset.    Start metformin XR with one 500mg  tablet at night. After one week, you may increase to two tablets at night ( total of 1000mg ) . The third week, you may take take two tablets at night and one tablet in the morning.  The fourth week, you may take two tablets in the morning ( 1000mg  total) and two tablets at night (1000mg  total). This will bring you to a maximum daily dose of 2000mg /day which is maximum dose.  So you are aware,  you may take ALL 4 tablets of metformin together at the same time if preferable and doesn't cause GI upset. You may take metformin 2000mg  ( four of the 500mg  tablets) together in the morning or at night if you prefer.   Along the way, if you want to increase more slowly, please do as this medication can cause GI discomfort and loose stools which usually get better with time , however some patients find that they can only tolerate a certain dose and cannot increase to maximum dose.

## 2023-04-04 NOTE — Telephone Encounter (Signed)
Completing due to other encounter-drug not covered

## 2023-04-04 NOTE — Progress Notes (Signed)
Virtual Visit via Video Note  I connected with Jennifer Rowland on 04/04/23 at 12:00 PM EST by a video enabled telemedicine application and verified that I am speaking with the correct person using two identifiers. Location patient: home Location provider: work  Persons participating in the virtual visit: patient, provider  I discussed the limitations of evaluation and management by telemedicine and the availability of in person appointments. The patient expressed understanding and agreed to proceed.  HPI: Appointment to discuss metformin  She has been walking more and following more higher protein diet.   She has used mounjaro  using one 1 year coupon .  She was quite pleased with Mounjaro and weight loss.  BP at home 137/77.  Compliant with amlodipine 5mg    She has seen advertising for semaglutide compounded  ROS: See pertinent positives and negatives per HPI.  EXAM:  VITALS per patient if applicable: BP 137/77   Pulse 92 Comment: Patient reported  Ht 5\' 3"  (1.6 m)   Wt 160 lb (72.6 kg)   LMP 12/19/2020 (Approximate)   BMI 28.34 kg/m  BP Readings from Last 3 Encounters:  04/04/23 137/77  03/21/23 (!) 142/80  02/18/21 122/72   Wt Readings from Last 3 Encounters:  04/04/23 160 lb (72.6 kg)  03/21/23 162 lb 3.2 oz (73.6 kg)  02/16/21 136 lb (61.7 kg)    GENERAL: alert, oriented, appears well and in no acute distress  HEENT: atraumatic, conjunttiva clear, no obvious abnormalities on inspection of external nose and ears  NECK: normal movements of the head and neck  LUNGS: on inspection no signs of respiratory distress, breathing rate appears normal, no obvious gross SOB, gasping or wheezing  CV: no obvious cyanosis  MS: moves all visible extremities without noticeable abnormality  PSYCH/NEURO: pleasant and cooperative, no obvious depression or anxiety, speech and thought processing grossly intact  ASSESSMENT AND PLAN: Overweight (BMI 25.0-29.9) Assessment &  Plan: Trial of metformin and counseled on side effects titration and mechanism of action.  Counseled in regards to FDA alert as it relates to compounded semaglutide.  I advised of safety concerns.  Orders: -     metFORMIN HCl ER; Take 1 tablet (500 mg total) by mouth every evening.  Dispense: 90 tablet; Refill: 2  Hypertension, unspecified type Assessment & Plan: Suboptimal control. Discussed blood pressure goal being less than 130/80.  Increase amlodipine 7.5 mg daily.  Advised in setting of pursuing weight loss if  blood pressure were to be lower, we may do a trial stop of additional 2.5 mg dose  Orders: -     amLODIPine Besylate; Take 1 tablet (2.5 mg total) by mouth daily.  Dispense: 90 tablet; Refill: 3     -we discussed possible serious and likely etiologies, options for evaluation and workup, limitations of telemedicine visit vs in person visit, treatment, treatment risks and precautions. Pt prefers to treat via telemedicine empirically rather then risking or undertaking an in person visit at this moment.    I discussed the assessment and treatment plan with the patient. The patient was provided an opportunity to ask questions and all were answered. The patient agreed with the plan and demonstrated an understanding of the instructions.   The patient was advised to call back or seek an in-person evaluation if the symptoms worsen or if the condition fails to improve as anticipated.  Advised if desired AVS can be mailed or viewed via MyChart if Mychart user.   [name], FNP

## 2023-04-04 NOTE — Assessment & Plan Note (Signed)
Trial of metformin and counseled on side effects titration and mechanism of action.  Counseled in regards to FDA alert as it relates to compounded semaglutide.  I advised of safety concerns.

## 2023-04-04 NOTE — Assessment & Plan Note (Signed)
Suboptimal control. Discussed blood pressure goal being less than 130/80.  Increase amlodipine 7.5 mg daily.  Advised in setting of pursuing weight loss if  blood pressure were to be lower, we may do a trial stop of additional 2.5 mg dose

## 2023-04-12 ENCOUNTER — Other Ambulatory Visit (HOSPITAL_COMMUNITY): Payer: Self-pay

## 2023-04-19 ENCOUNTER — Encounter: Payer: Self-pay | Admitting: Family

## 2023-04-22 NOTE — Telephone Encounter (Signed)
 Noted.

## 2023-05-03 ENCOUNTER — Ambulatory Visit (INDEPENDENT_AMBULATORY_CARE_PROVIDER_SITE_OTHER): Payer: Managed Care, Other (non HMO) | Admitting: Family

## 2023-05-03 ENCOUNTER — Encounter: Payer: Self-pay | Admitting: Family

## 2023-05-03 VITALS — BP 136/86 | HR 73 | Temp 98.2°F | Ht 62.0 in | Wt 163.0 lb

## 2023-05-03 DIAGNOSIS — Z23 Encounter for immunization: Secondary | ICD-10-CM | POA: Diagnosis not present

## 2023-05-03 DIAGNOSIS — Z6829 Body mass index (BMI) 29.0-29.9, adult: Secondary | ICD-10-CM

## 2023-05-03 DIAGNOSIS — E663 Overweight: Secondary | ICD-10-CM

## 2023-05-03 MED ORDER — BUPROPION HCL ER (XL) 150 MG PO TB24: 150.0000 mg | ORAL_TABLET | Freq: Every day | ORAL | 1 refills | Status: DC

## 2023-05-03 NOTE — Patient Instructions (Addendum)
May increase wellbutrin from 150 to 300mg  total in the morning if needed in 4-6 weeks.   We discussed today starting medication called Wellbutrin.  As also discussed, you must limit alcohol on this medication as alcohol and Wellbutrin together may increase your risk for seizure.  You may drink no more than 1 alcoholic beverage on this medication.  A standard drink is 12 ounces of regular beer, which is usually about 5% alcohol OR 5 ounces of wine, which is typically about 12% alcohol OR   1.5 ounces of distilled spirits, which is about 40% alcohol    Nice too see you!

## 2023-05-03 NOTE — Assessment & Plan Note (Addendum)
Trial of Wellbutrin to aid in weight loss.  Counseled on side effects, mechanism action and to avoid alcohol. discussed referral to [facility].  Patient politely declines at this time.  May consider in the future

## 2023-05-03 NOTE — Progress Notes (Signed)
Assessment & Plan:  Overweight (BMI 25.0-29.9) Assessment & Plan: Trial of Wellbutrin to aid in weight loss.  Counseled on side effects, mechanism action and to avoid alcohol. discussed referral to [facility].  Patient politely declines at this time.  May consider in the future  Orders: -     buPROPion HCl ER (XL); Take 1 tablet (150 mg total) by mouth daily.  Dispense: 30 tablet; Refill: 1  Need for shingles vaccine -     Varicella-zoster vaccine IM     Return precautions given.   Risks, benefits, and alternatives of the medications and treatment plan prescribed today were discussed, and patient expressed understanding.   Education regarding symptom management and diagnosis given to patient on AVS either electronically or printed.  Return for Complete Physical Exam.  [name], FNP  Subjective:    Patient ID: Jennifer Rowland, female    DOB: Apr 21, 1970, 53 y.o.   MRN: [id number]  CC: Jennifer Rowland is a 53 y.o. female who presents today for follow up.   HPI: Frustrated by difficulty losing weight.  Insurance does not cover Zepbound.  Intolerant to metformin.  She had used phentermine years ago with temporary effectiveness She is thinking about starting Pilates No history of seizure, anorexia, bulimia.  No heavy alcohol use   Previously tried [employer] ( not approved) Metformin caused GI distress   Never smoker    Allergies: Percocet [oxycodone-acetaminophen] Current Outpatient Medications on File Prior to Visit  Medication Sig Dispense Refill   amLODipine (NORVASC) 2.5 MG tablet Take 1 tablet (2.5 mg total) by mouth daily. 90 tablet 3   amLODipine (NORVASC) 5 MG tablet Take 7.5 mg by mouth at bedtime.     amphetamine-dextroamphetamine (ADDERALL) 15 MG tablet Take 15 mg by mouth daily.     estradiol (ESTRACE) 1 MG tablet Take 1 tablet (1 mg total) by mouth daily.     metFORMIN (GLUCOPHAGE-XR) 500 MG 24 hr tablet Take 1 tablet (500 mg  total) by mouth every evening. 90 tablet 2   Multiple Vitamin (MULTIVITAMIN) capsule Take 1 capsule by mouth daily.     Vitamin D, Cholecalciferol, 25 MCG (1000 UT) CAPS Take 1,000 Units by mouth daily as needed (Vitamin).     No current facility-administered medications on file prior to visit.    Review of Systems  Constitutional:  Negative for chills and fever.  Respiratory:  Negative for cough.   Cardiovascular:  Negative for chest pain and palpitations.  Gastrointestinal:  Negative for nausea and vomiting.      Objective:    BP 136/86   Pulse 73   Temp 98.2 F (36.8 C) (Oral)   Ht 5\' 2"  (1.575 m)   Wt 163 lb (73.9 kg)   LMP 12/19/2020 (Approximate)   SpO2 98%   BMI 29.81 kg/m  BP Readings from Last 3 Encounters:  05/03/23 136/86  04/04/23 137/77  03/21/23 (!) 142/80   Wt Readings from Last 3 Encounters:  05/03/23 163 lb (73.9 kg)  04/04/23 160 lb (72.6 kg)  03/21/23 162 lb 3.2 oz (73.6 kg)    Physical Exam Vitals reviewed.  Constitutional:      Appearance: She is well-developed.  Eyes:     Conjunctiva/sclera: Conjunctivae normal.  Cardiovascular:     Rate and Rhythm: Normal rate and regular rhythm.     Pulses: Normal pulses.     Heart sounds: Normal heart sounds.  Pulmonary:     Effort: Pulmonary effort is  normal.     Breath sounds: Normal breath sounds. No wheezing, rhonchi or rales.  Skin:    General: Skin is warm and dry.  Neurological:     Mental Status: She is alert.  Psychiatric:        Speech: Speech normal.        Behavior: Behavior normal.        Thought Content: Thought content normal.

## 2023-05-25 ENCOUNTER — Other Ambulatory Visit: Payer: Self-pay | Admitting: Family

## 2023-05-25 DIAGNOSIS — E663 Overweight: Secondary | ICD-10-CM

## 2023-05-28 ENCOUNTER — Encounter: Payer: Self-pay | Admitting: Family

## 2023-05-29 ENCOUNTER — Other Ambulatory Visit: Payer: Self-pay | Admitting: Family

## 2023-05-29 DIAGNOSIS — I10 Essential (primary) hypertension: Secondary | ICD-10-CM

## 2023-05-29 MED ORDER — AMLODIPINE BESYLATE 10 MG PO TABS: 10.0000 mg | ORAL_TABLET | Freq: Every day | ORAL | 3 refills | Status: DC

## 2023-06-05 ENCOUNTER — Telehealth: Payer: Managed Care, Other (non HMO) | Admitting: Family

## 2023-06-05 ENCOUNTER — Encounter: Payer: Self-pay | Admitting: Family

## 2023-06-05 VITALS — BP 141/80 | Ht 62.0 in | Wt 162.0 lb

## 2023-06-05 DIAGNOSIS — F909 Attention-deficit hyperactivity disorder, unspecified type: Secondary | ICD-10-CM

## 2023-06-05 DIAGNOSIS — Z8249 Family history of ischemic heart disease and other diseases of the circulatory system: Secondary | ICD-10-CM | POA: Diagnosis not present

## 2023-06-05 DIAGNOSIS — I10 Essential (primary) hypertension: Secondary | ICD-10-CM | POA: Diagnosis not present

## 2023-06-05 MED ORDER — HYDROCHLOROTHIAZIDE 12.5 MG PO CAPS: 12.5000 mg | ORAL_CAPSULE | Freq: Every day | ORAL | 1 refills | Status: DC | PRN

## 2023-06-05 MED ORDER — AMPHETAMINE-DEXTROAMPHETAMINE 15 MG PO TABS: 15.0000 mg | ORAL_TABLET | Freq: Every day | ORAL | 0 refills | Status: DC

## 2023-06-05 NOTE — Patient Instructions (Addendum)
 I have ordered CT calcium score ( SELF PAY option)  to further stratify your overall cardiovascular risk .   An estimate of cost is $150-200 out-of-pocket as not covered by insurance.     I have placed your order to [facility] in [place] as  generally most convenient.   Phone Number to [facility] on [address] is is [phone] to get scheduled.   Please call to get scheduled and if any issues at all in doing so, please let me know.   Below an article from [facility] regarding the test.   [email or web address]/exams-[email or web address]-[email or web address]-ct#:~:[email or web address]%[email or web address]%20CT%20calcium%20score,arteries%20can%20cause%20heart%20attacks.   Exams We Offer: Cardiac CT Calcium Score  Knowing your score could save your life. A cardiac CT calcium score, also known as a coronary calcium scan, is a quick, convenient and noninvasive way of evaluating the amount of calcified (hard) plaque in your heart vessels. The level of calcium equates to the extent of plaque build-up in your arteries. Plaque in the arteries can cause heart attacks.  The radiologist reads the images and sends your doctor a report with a calcium score. Patients with higher scores have a greater risk for a heart attack, heart disease or stroke. Knowing your score can help your doctor decide on blood pressure and cholesterol goals that will minimize your risk as much as possible.  The [employer] of Cardiology found that Coronary artery calcification (CAC) is an excellent cardiovascular disease risk marker and can help guide the decision to use cholesterol reducing medications such as statins. A negative calcium score may reduce the need for statins in otherwise eligible patients.  The exam takes less than 10 minutes, is painless and does not require any IV or oral contrast. At [facility] locations, the out-of-pocket fee without insurance is $75. At the time of  scheduling, please let us know if you want to process the exam through your insurance or self-pay at the rate of $75. Patients who want to self-pay should not submit their insurance card when checking in for the appointment.   Who should get a Cardiac CT Calcium Score: Middle age adults at intermediate risk of heart disease Family history of heart disease Borderline high cholesterol, high blood pressure or diabetes Overweight or physical inactivity Uncertain about taking daily preventive medical therapy

## 2023-06-05 NOTE — Progress Notes (Unsigned)
 Virtual Visit via Video Note  I connected with Jennifer Rowland on 06/06/23 at  8:30 AM EST by a video enabled telemedicine application and verified that I am speaking with the correct person using two identifiers. Location patient: home Location provider: home Persons participating in the virtual visit: patient, provider  I discussed the limitations of evaluation and management by telemedicine and the availability of in person appointments. The patient expressed understanding and agreed to proceed.  HPI: Follow-up blood pressure  Increased amlddipine to 10mg  one week ago  She takes BP typically in the morning.   BP fluctuates from 145/80 to 128/70. More rare to be less than 130/80.   Left arm 129/89 and right arm 141/80  [facility]ed by [facility], continued by Dr Hyacinth Meeker, prior PCP. She reports formal neurological with neurologist testing 2010 at [facility] diagnosed ADHD.   She takes adderall 15mg  in the morning and request a refill   Sleeping well.  Denies palpitations, increased anxiety on Adderall  She shares concerns in regards to family history of heart attack.  Denies chest pain, shortness of breath   ROS: See pertinent positives and negatives per HPI.  EXAM:  VITALS per patient if applicable: BP (!) 141/80 (BP Location: Right Arm, Cuff Size: Normal)   Ht 5\' 2"  (1.575 m)   Wt 162 lb (73.5 kg)   LMP 12/19/2020 (Approximate)   BMI 29.63 kg/m  BP Readings from Last 3 Encounters:  06/05/23 (!) 141/80  05/03/23 136/86  04/04/23 137/77   Wt Readings from Last 3 Encounters:  06/05/23 162 lb (73.5 kg)  05/03/23 163 lb (73.9 kg)  04/04/23 160 lb (72.6 kg)    GENERAL: alert, oriented, appears well and in no acute distress  HEENT: atraumatic, conjunttiva clear, no obvious abnormalities on inspection of external nose and ears  NECK: normal movements of the head and neck  LUNGS: on inspection no signs of respiratory distress, breathing rate appears normal, no obvious gross SOB,  gasping or wheezing  CV: no obvious cyanosis  MS: moves all visible extremities without noticeable abnormality  PSYCH/NEURO: pleasant and cooperative, no obvious depression or anxiety, speech and thought processing grossly intact  ASSESSMENT AND PLAN: Hypertension, unspecified type Assessment & Plan: BP readings appear labile.  Continue amlodipine 10 mg daily.  Provided HCTZ 12.5 mg to take when blood pressure > 140/80.  Close follow-up   Orders: -     hydroCHLOROthiazide; Take 1 capsule (12.5 mg total) by mouth daily as needed (take if BP > 140/80.).  Dispense: 90 capsule; Refill: 1  Attention deficit hyperactivity disorder (ADHD), unspecified ADHD type Assessment & Plan: Chronic, stable.  Formally diagnosed 2010 per patient. Stable without side effects.  Continue Adderall 15 mg daily.  Refill provided  Orders: -     Amphetamine-Dextroamphetamine; Take 1 tablet by mouth daily. Take 15 mg by mouth daily.  Dispense: 30 tablet; Refill: 0 -     Amphetamine-Dextroamphetamine; Take 1 tablet by mouth daily.  Dispense: 30 tablet; Refill: 0 -     Amphetamine-Dextroamphetamine; Take 1 tablet by mouth daily.  Dispense: 30 tablet; Refill: 0  Family history of ASCVD Assessment & Plan: Discussed family history of ASCVD.  We jointly agreed CT calcium score is appropriate and I have ordered.   Orders: -     CT CARDIAC SCORING (SELF PAY ONLY); Future     -we discussed possible serious and likely etiologies, options for evaluation and workup, limitations of telemedicine visit vs in person visit, treatment, treatment  risks and precautions. Pt prefers to treat via telemedicine empirically rather then risking or undertaking an in person visit at this moment.    I discussed the assessment and treatment plan with the patient. The patient was provided an opportunity to ask questions and all were answered. The patient agreed with the plan and demonstrated an understanding of the instructions.   The  patient was advised to call back or seek an in-person evaluation if the symptoms worsen or if the condition fails to improve as anticipated.  Advised if desired AVS can be mailed or viewed via MyChart if Mychart user.   [name], FNP

## 2023-06-06 NOTE — Assessment & Plan Note (Signed)
 Discussed family history of ASCVD.  We jointly agreed CT calcium score is appropriate and I have ordered.

## 2023-06-06 NOTE — Assessment & Plan Note (Signed)
 BP readings appear labile.  Continue amlodipine 10 mg daily.  Provided HCTZ 12.5 mg to take when blood pressure > 140/80.  Close follow-up

## 2023-06-06 NOTE — Assessment & Plan Note (Signed)
 Chronic, stable.  Formally diagnosed 2010 per patient. Stable without side effects.  Continue Adderall 15 mg daily.  Refill provided

## 2023-06-07 ENCOUNTER — Ambulatory Visit
Admission: RE | Admit: 2023-06-07 | Discharge: 2023-06-07 | Disposition: A | Discharge status: Home / Self Care | Payer: Self-pay | Source: Ambulatory Visit | Attending: Family | Admitting: Family

## 2023-06-07 ENCOUNTER — Encounter: Payer: Self-pay | Admitting: Family

## 2023-06-07 ENCOUNTER — Other Ambulatory Visit: Payer: Self-pay

## 2023-06-07 DIAGNOSIS — Z79890 Hormone replacement therapy (postmenopausal): Secondary | ICD-10-CM

## 2023-06-07 DIAGNOSIS — I10 Essential (primary) hypertension: Secondary | ICD-10-CM

## 2023-06-07 DIAGNOSIS — Z8249 Family history of ischemic heart disease and other diseases of the circulatory system: Secondary | ICD-10-CM | POA: Insufficient documentation

## 2023-06-25 ENCOUNTER — Ambulatory Visit (INDEPENDENT_AMBULATORY_CARE_PROVIDER_SITE_OTHER)

## 2023-06-25 ENCOUNTER — Other Ambulatory Visit (INDEPENDENT_AMBULATORY_CARE_PROVIDER_SITE_OTHER): Payer: Managed Care, Other (non HMO)

## 2023-06-25 DIAGNOSIS — Z79890 Hormone replacement therapy (postmenopausal): Secondary | ICD-10-CM | POA: Diagnosis not present

## 2023-06-25 DIAGNOSIS — Z23 Encounter for immunization: Secondary | ICD-10-CM | POA: Diagnosis not present

## 2023-06-25 DIAGNOSIS — I10 Essential (primary) hypertension: Secondary | ICD-10-CM | POA: Diagnosis not present

## 2023-06-25 LAB — COMPREHENSIVE METABOLIC PANEL
ALT: 32 U/L (ref 0–35)
AST: 23 U/L (ref 0–37)
Albumin: 4.1 g/dL (ref 3.5–5.2)
Alkaline Phosphatase: 80 U/L (ref 39–117)
BUN: 16 mg/dL (ref 6–23)
CO2: 31 meq/L (ref 19–32)
Calcium: 9.5 mg/dL (ref 8.4–10.5)
Chloride: 103 meq/L (ref 96–112)
Creatinine, Ser: 0.84 mg/dL (ref 0.40–1.20)
GFR: 80.49 mL/min (ref >60.00)
Glucose, Bld: 99 mg/dL (ref 70–99)
Potassium: 3.7 meq/L (ref 3.5–5.1)
Sodium: 141 meq/L (ref 135–145)
Total Bilirubin: 0.3 mg/dL (ref 0.2–1.2)
Total Protein: 6.9 g/dL (ref 6.0–8.3)

## 2023-06-25 LAB — CBC WITH DIFFERENTIAL/PLATELET
Basophils Absolute: 0 10*3/uL (ref 0.0–0.1)
Basophils Relative: 0.7 % (ref 0.0–3.0)
Eosinophils Absolute: 0.1 10*3/uL (ref 0.0–0.7)
Eosinophils Relative: 2.4 % (ref 0.0–5.0)
HCT: 41.5 % (ref 36.0–46.0)
Hemoglobin: 13.7 g/dL (ref 12.0–15.0)
Lymphocytes Relative: 16.8 % (ref 12.0–46.0)
Lymphs Abs: 1 10*3/uL (ref 0.7–4.0)
MCHC: 33.1 g/dL (ref 30.0–36.0)
MCV: 90.6 fl (ref 78.0–100.0)
Monocytes Absolute: 0.3 10*3/uL (ref 0.1–1.0)
Monocytes Relative: 5.6 % (ref 3.0–12.0)
Neutro Abs: 4.5 10*3/uL (ref 1.4–7.7)
Neutrophils Relative %: 74.5 % (ref 43.0–77.0)
Platelets: 201 10*3/uL (ref 150.0–400.0)
RBC: 4.57 Mil/uL (ref 3.87–5.11)
RDW: 13.4 % (ref 11.5–15.5)
WBC: 6.1 10*3/uL (ref 4.0–10.5)

## 2023-06-25 LAB — LIPID PANEL
Cholesterol: 212 mg/dL — ABNORMAL HIGH (ref 0–200)
HDL: 60.9 mg/dL (ref >39.00)
LDL Cholesterol: 120 mg/dL — ABNORMAL HIGH (ref 0–99)
NonHDL: 151.03
Total CHOL/HDL Ratio: 3
Triglycerides: 154 mg/dL — ABNORMAL HIGH (ref 0.0–149.0)
VLDL: 30.8 mg/dL (ref 0.0–40.0)

## 2023-06-25 LAB — HEMOGLOBIN A1C: Hgb A1c MFr Bld: 5.4 % (ref 4.6–6.5)

## 2023-06-25 LAB — TSH: TSH: 1.67 u[IU]/mL (ref 0.35–5.50)

## 2023-06-25 LAB — VITAMIN B12: Vitamin B-12: 248 pg/mL (ref 211–911)

## 2023-06-25 NOTE — Progress Notes (Signed)
 Patient presented for influenza vaccine to left deltoid, patient voiced no concerns nor showed any signs of distress during injection

## 2023-06-26 ENCOUNTER — Encounter: Payer: Self-pay | Admitting: Family

## 2023-06-26 ENCOUNTER — Other Ambulatory Visit: Payer: Self-pay | Admitting: Family

## 2023-06-26 DIAGNOSIS — E538 Deficiency of other specified B group vitamins: Secondary | ICD-10-CM

## 2023-07-01 ENCOUNTER — Other Ambulatory Visit (INDEPENDENT_AMBULATORY_CARE_PROVIDER_SITE_OTHER)

## 2023-07-01 DIAGNOSIS — E538 Deficiency of other specified B group vitamins: Secondary | ICD-10-CM | POA: Diagnosis not present

## 2023-07-04 ENCOUNTER — Encounter: Payer: Self-pay | Admitting: Family

## 2023-07-04 LAB — INTRINSIC FACTOR ANTIBODIES: Intrinsic Factor Abs, Serum: 1 [AU]/ml (ref 0.0–1.1)

## 2023-07-04 LAB — METHYLMALONIC ACID, SERUM: Methylmalonic Acid: 154 nmol/L (ref 0–378)

## 2023-07-04 LAB — CELIAC DISEASE AB SCREEN W/RFX
Antigliadin Abs, IgA: 6 U (ref 0–19)
IgA/Immunoglobulin A, Serum: 119 mg/dL (ref 87–352)
Transglutaminase IgA: <2 U/mL (ref 0–3)

## 2023-07-04 LAB — HOMOCYSTEINE: Homocysteine: 9.2 umol/L (ref 0.0–14.5)

## 2023-07-05 ENCOUNTER — Encounter: Payer: Self-pay | Admitting: Family

## 2023-07-05 DIAGNOSIS — E538 Deficiency of other specified B group vitamins: Secondary | ICD-10-CM | POA: Insufficient documentation

## 2023-07-15 ENCOUNTER — Telehealth: Payer: Self-pay

## 2023-07-15 ENCOUNTER — Encounter: Payer: Self-pay | Admitting: Family

## 2023-07-15 ENCOUNTER — Ambulatory Visit: Payer: Self-pay | Admitting: *Deleted

## 2023-07-15 NOTE — Telephone Encounter (Signed)
  Chief Complaint: bilateral foot , ankle swelling up to calf  Symptoms: bilateral foot, ankle up to calf swelling. On going since starting hydrochlorothiazide per patient, now not taking medication but swelling persists.  Frequency: 3 weeks  Pertinent Negatives: Patient denies chest pain no difficulty breathing no fever Disposition: [] ED /[] Urgent Care (no appt availability in office) / [x] Appointment(In office/virtual)/ []  [facility]/ [] Home Care/ [] Refused Recommended Disposition /[] [place]/ []  Follow-up with PCP Additional Notes:   Appt scheduled tomorrow with PCP  Recommended if sx worsen call back .     Copied from CRM [id number]. Topic: Clinical - Red Word Triage >> Jul 15, 2023 10:42 AM Cammy Copa D wrote: Red Word that prompted transfer to Nurse Triage: Swelling: Patient stated that her feet have been swollen for the last 3 weeks. Reason for Disposition  Ankle swelling is a chronic symptom (recurrent or ongoing AND present > 4 weeks)  Answer Assessment - Initial Assessment Questions 1. LOCATION: "Which ankle is swollen?" "Where is the swelling?"     Bilateral feet and ankle up to calf 2. ONSET: "When did the swelling start?"     3 weeks  3. SWELLING: "How bad is the swelling?" Or, "How large is it?" (e.g., mild, moderate, severe; size of localized swelling)    - NONE: No joint swelling.   - LOCALIZED: Localized; small area of puffy or swollen skin (e.g., insect bite, skin irritation).   - MILD: Joint looks or feels mildly swollen or puffy.   - MODERATE: Swollen; interferes with normal activities (e.g., work or school); decreased range of movement; may be limping.   - SEVERE: Very swollen; can't move swollen joint at all; limping a lot or unable to walk.     Mild , shoes are snugg 4. PAIN: "Is there any pain?" If Yes, ask: "How bad is it?" (Scale 1-10; or mild, moderate, severe)   - NONE (0): no pain.   - MILD (1-3): doesn't interfere with normal  activities.    - MODERATE (4-7): interferes with normal activities (e.g., work or school) or awakens from sleep, limping.    - SEVERE (8-10): excruciating pain, unable to do any normal activities, unable to walk.      na 5. CAUSE: "What do you think caused the ankle swelling?"     Not sure  6. OTHER SYMPTOMS: "Do you have any other symptoms?" (e.g., fever, chest pain, difficulty breathing, calf pain)     No  7. PREGNANCY: "Is there any chance you are pregnant?" "When was your last menstrual period?"     na  Protocols used: Ankle Swelling-A-AH

## 2023-07-15 NOTE — Telephone Encounter (Signed)
 LVM to call back to discuss message below per Claris Che please inform pt when she calls back   Hi Jennifer Rowland,  Appreciate your letting me know.    I have to suspect the leg swelling is more of a side effect of amlodipine 10 mg.  Please cut your amlodipine 10 mg in half so that you are taking 5 mg tablet.  Please take hydrochlorothiazide 12.5 mg daily.  Please call to schedule follow-up with me in the next week so we can recheck your labs and blood pressure on new regimen.

## 2023-07-15 NOTE — Telephone Encounter (Signed)
Pt advised to schedule an appt.

## 2023-07-15 NOTE — Telephone Encounter (Signed)
 LVM to call back to discuss my chart message

## 2023-07-16 ENCOUNTER — Ambulatory Visit (INDEPENDENT_AMBULATORY_CARE_PROVIDER_SITE_OTHER): Admitting: Family

## 2023-07-16 ENCOUNTER — Encounter: Payer: Self-pay | Admitting: Family

## 2023-07-16 VITALS — BP 140/75 | HR 61 | Temp 97.7°F | Ht 63.0 in | Wt 166.0 lb

## 2023-07-16 DIAGNOSIS — E663 Overweight: Secondary | ICD-10-CM

## 2023-07-16 DIAGNOSIS — R899 Unspecified abnormal finding in specimens from other organs, systems and tissues: Secondary | ICD-10-CM

## 2023-07-16 DIAGNOSIS — E538 Deficiency of other specified B group vitamins: Secondary | ICD-10-CM | POA: Diagnosis not present

## 2023-07-16 DIAGNOSIS — I10 Essential (primary) hypertension: Secondary | ICD-10-CM | POA: Diagnosis not present

## 2023-07-16 NOTE — Assessment & Plan Note (Signed)
 Metformin, bupropion ineffective. Walking daily and continues to gain weight. Encouraged [facility], [employer].

## 2023-07-16 NOTE — Assessment & Plan Note (Signed)
 Uncontrolled.  Continue amlodipine 5 mg (started yesterday, decreased from 10 mg).  Increase hydrochlorothiazide to 25 mg daily.  BMP lab in 3 days time.

## 2023-07-16 NOTE — Patient Instructions (Addendum)
 Call [facility] to inquire about cost, programs.   [phone]   Increase hydrochlorothiazide to 25mg  daily Continue amlodipine 5mg  daily   Goal of blood pressure < 130/80

## 2023-07-16 NOTE — Telephone Encounter (Signed)
 Pt has viewed message in my chart and has appt today 07/16/23

## 2023-07-16 NOTE — Progress Notes (Signed)
 Assessment & Plan:  Hypertension, unspecified type Assessment & Plan: Uncontrolled.  Continue amlodipine 5 mg (started yesterday, decreased from 10 mg).  Increase hydrochlorothiazide to 25 mg daily.  BMP lab in 3 days time.  Orders: -     Basic metabolic panel with GFR; Future  Overweight (BMI 25.0-29.9) Assessment & Plan: Metformin, bupropion ineffective. Walking daily and continues to gain weight. Encouraged [facility], [employer].     B12 deficiency -     B12 and Folate Panel; Future     Return precautions given.   Risks, benefits, and alternatives of the medications and treatment plan prescribed today were discussed, and patient expressed understanding.   Education regarding symptom management and diagnosis given to patient on AVS either electronically or printed.  Return in about 6 weeks (around 08/27/2023).  [name], FNP  Subjective:    Patient ID: Jennifer Rowland, female    DOB: 08/23/1970, 53 y.o.   MRN: [id number]  CC: Jennifer Rowland is a 53 y.o. female who presents today for an acute visit.    HPI: Complains BL leg swelling x 3 weeks Improved initially when supine and sleeping.  Denies SOB, CP. No sodium indiscretion.  No recent travel, immobilization. Over the past week , leg swelling has been persistent.   She has been on amlodipine 5mg  for 3-4 years without leg swelling.       She is frustrated by weight gain.  She lost 30lbs on zepbound in 2023 using manufacturer's coupon. She has gained weight back.  Metformin, bupropion ineffective. She is walking daily.    Allergies: Percocet [oxycodone-acetaminophen] Current Outpatient Medications on File Prior to Visit  Medication Sig Dispense Refill   amLODipine (NORVASC) 10 MG tablet Take 1 tablet (10 mg total) by mouth daily. 90 tablet 3   amphetamine-dextroamphetamine (ADDERALL) 15 MG tablet Take 1 tablet by mouth daily. Take 15 mg by mouth daily. 30 tablet 0    amphetamine-dextroamphetamine (ADDERALL) 15 MG tablet Take 1 tablet by mouth daily. 30 tablet 0   amphetamine-dextroamphetamine (ADDERALL) 15 MG tablet Take 1 tablet by mouth daily. 30 tablet 0   estradiol (ESTRACE) 1 MG tablet Take 1 tablet (1 mg total) by mouth daily.     hydrochlorothiazide (MICROZIDE) 12.5 MG capsule Take 1 capsule (12.5 mg total) by mouth daily as needed (take if BP > 140/80.). 90 capsule 1   Multiple Vitamin (MULTIVITAMIN) capsule Take 1 capsule by mouth daily.     Vitamin D, Cholecalciferol, 25 MCG (1000 UT) CAPS Take 1,000 Units by mouth daily as needed (Vitamin).     No current facility-administered medications on file prior to visit.    Review of Systems  Constitutional:  Negative for chills and fever.  Respiratory:  Negative for cough and shortness of breath.   Cardiovascular:  Positive for leg swelling. Negative for chest pain and palpitations.  Gastrointestinal:  Negative for nausea and vomiting.      Objective:    BP (!) 140/75   Pulse 61   Temp 97.7 F (36.5 C) (Oral)   Ht 5\' 3"  (1.6 m)   Wt 166 lb (75.3 kg)   LMP 12/19/2020 (Approximate)   SpO2 99%   BMI 29.41 kg/m   BP Readings from Last 3 Encounters:  07/16/23 (!) 140/75  06/05/23 (!) 141/80  05/03/23 136/86   Wt Readings from Last 3 Encounters:  07/16/23 166 lb (75.3 kg)  06/05/23 162 lb (73.5 kg)  05/03/23 163 lb (73.9  kg)    Physical Exam Vitals reviewed.  Constitutional:      Appearance: She is well-developed.  Eyes:     Conjunctiva/sclera: Conjunctivae normal.  Cardiovascular:     Rate and Rhythm: Normal rate and regular rhythm.     Pulses: Normal pulses.     Heart sounds: Normal heart sounds.     Comments: Non pitting +1 BLE edema to mid calf. No palpable cords or masses. No erythema or increased warmth. No asymmetry in calf size when compared bilaterally LE hair growth symmetric and present. No discoloration or varicosities noted. LE warm and palpable pedal  pulses.  Pulmonary:     Effort: Pulmonary effort is normal.     Breath sounds: Normal breath sounds. No wheezing, rhonchi or rales.  Musculoskeletal:     Right lower leg: Edema present.     Left lower leg: Edema present.  Skin:    General: Skin is warm and dry.  Neurological:     Mental Status: She is alert.  Psychiatric:        Speech: Speech normal.        Behavior: Behavior normal.        Thought Content: Thought content normal.

## 2023-07-17 ENCOUNTER — Encounter: Payer: Managed Care, Other (non HMO) | Admitting: Family

## 2023-07-19 ENCOUNTER — Other Ambulatory Visit

## 2023-07-19 DIAGNOSIS — I10 Essential (primary) hypertension: Secondary | ICD-10-CM | POA: Diagnosis not present

## 2023-07-19 DIAGNOSIS — E538 Deficiency of other specified B group vitamins: Secondary | ICD-10-CM

## 2023-07-19 LAB — BASIC METABOLIC PANEL WITH GFR
BUN: 15 mg/dL (ref 6–23)
CO2: 31 meq/L (ref 19–32)
Calcium: 9.8 mg/dL (ref 8.4–10.5)
Chloride: 97 meq/L (ref 96–112)
Creatinine, Ser: 0.96 mg/dL (ref 0.40–1.20)
GFR: 68.54 mL/min (ref >60.00)
Glucose, Bld: 106 mg/dL — ABNORMAL HIGH (ref 70–99)
Potassium: 3.1 meq/L — ABNORMAL LOW (ref 3.5–5.1)
Sodium: 139 meq/L (ref 135–145)

## 2023-07-19 LAB — B12 AND FOLATE PANEL
Folate: >25.2 ng/mL (ref >5.9)
Vitamin B-12: 1344 pg/mL — ABNORMAL HIGH (ref 211–911)

## 2023-07-22 ENCOUNTER — Other Ambulatory Visit: Payer: Self-pay | Admitting: Family

## 2023-07-22 ENCOUNTER — Encounter: Payer: Self-pay | Admitting: Family

## 2023-07-22 ENCOUNTER — Other Ambulatory Visit (HOSPITAL_COMMUNITY): Payer: Self-pay

## 2023-07-22 ENCOUNTER — Telehealth: Payer: Self-pay

## 2023-07-22 DIAGNOSIS — I10 Essential (primary) hypertension: Secondary | ICD-10-CM

## 2023-07-22 DIAGNOSIS — E663 Overweight: Secondary | ICD-10-CM

## 2023-07-22 MED ORDER — POTASSIUM CHLORIDE CRYS ER 20 MEQ PO TBCR: 20.0000 meq | EXTENDED_RELEASE_TABLET | Freq: Every day | ORAL | 3 refills | Status: DC

## 2023-07-22 MED ORDER — AMLODIPINE BESYLATE 5 MG PO TABS: 5.0000 mg | ORAL_TABLET | Freq: Every day | ORAL | 3 refills | Status: AC

## 2023-07-22 MED ORDER — SEMAGLUTIDE(0.25 OR 0.5MG/DOS) 2 MG/3ML ~~LOC~~ SOPN: 0.2500 mg | PEN_INJECTOR | SUBCUTANEOUS | 2 refills | Status: DC

## 2023-07-22 MED ORDER — HYDROCHLOROTHIAZIDE 25 MG PO TABS: 25.0000 mg | ORAL_TABLET | Freq: Every day | ORAL | 3 refills | Status: AC

## 2023-07-22 NOTE — Addendum Note (Signed)
 Addended by: [place], Artrell Lawless on: 07/22/2023 03:03 PM   Modules accepted: Orders

## 2023-07-22 NOTE — Telephone Encounter (Signed)
 Prior Authorization form/request asks a question that requires your assistance. Please see the question below and advise accordingly. The PA will not be submitted until the necessary information is received.  Does patient have type 2 diabetes?

## 2023-07-24 ENCOUNTER — Other Ambulatory Visit (HOSPITAL_COMMUNITY): Payer: Self-pay

## 2023-07-29 ENCOUNTER — Other Ambulatory Visit (INDEPENDENT_AMBULATORY_CARE_PROVIDER_SITE_OTHER)

## 2023-07-29 DIAGNOSIS — R899 Unspecified abnormal finding in specimens from other organs, systems and tissues: Secondary | ICD-10-CM | POA: Diagnosis not present

## 2023-07-29 LAB — BASIC METABOLIC PANEL WITH GFR
BUN: 20 mg/dL (ref 6–23)
CO2: 29 meq/L (ref 19–32)
Calcium: 9.5 mg/dL (ref 8.4–10.5)
Chloride: 99 meq/L (ref 96–112)
Creatinine, Ser: 0.97 mg/dL (ref 0.40–1.20)
GFR: 67.68 mL/min (ref >60.00)
Glucose, Bld: 110 mg/dL — ABNORMAL HIGH (ref 70–99)
Potassium: 3.5 meq/L (ref 3.5–5.1)
Sodium: 138 meq/L (ref 135–145)

## 2023-07-29 NOTE — Telephone Encounter (Signed)
 Sent pt message via my chart to inform

## 2023-07-29 NOTE — Telephone Encounter (Signed)

## 2023-07-30 NOTE — Telephone Encounter (Signed)
 Pt stated that she is willing to try any of medication listed below that will be covered by insurance. I stated to pt that I will let [facility] know when she returns and they can decide which one to move forward with

## 2023-07-31 ENCOUNTER — Encounter: Payer: Self-pay | Admitting: Family

## 2023-08-02 ENCOUNTER — Encounter: Payer: Self-pay | Admitting: Family

## 2023-08-05 ENCOUNTER — Other Ambulatory Visit

## 2023-08-06 NOTE — Telephone Encounter (Signed)
 Noted  Sent mychart message 08/06/23

## 2023-08-20 NOTE — Telephone Encounter (Signed)
 Noted.

## 2023-08-27 ENCOUNTER — Ambulatory Visit: Admitting: Family

## 2023-09-19 ENCOUNTER — Ambulatory Visit (INDEPENDENT_AMBULATORY_CARE_PROVIDER_SITE_OTHER): Payer: Managed Care, Other (non HMO) | Admitting: Family

## 2023-09-19 ENCOUNTER — Ambulatory Visit: Payer: Self-pay | Admitting: Family

## 2023-09-19 ENCOUNTER — Encounter: Payer: Self-pay | Admitting: Family

## 2023-09-19 VITALS — BP 130/70 | HR 76 | Temp 97.8°F | Ht 63.0 in | Wt 156.2 lb

## 2023-09-19 DIAGNOSIS — Z0000 Encounter for general adult medical examination without abnormal findings: Secondary | ICD-10-CM | POA: Diagnosis not present

## 2023-09-19 DIAGNOSIS — E876 Hypokalemia: Secondary | ICD-10-CM

## 2023-09-19 DIAGNOSIS — I10 Essential (primary) hypertension: Secondary | ICD-10-CM

## 2023-09-19 LAB — BASIC METABOLIC PANEL WITH GFR
BUN: 18 mg/dL (ref 6–23)
CO2: 27 meq/L (ref 19–32)
Calcium: 9.5 mg/dL (ref 8.4–10.5)
Chloride: 96 meq/L (ref 96–112)
Creatinine, Ser: 1.06 mg/dL (ref 0.40–1.20)
GFR: 60.79 mL/min (ref >60.00)
Glucose, Bld: 76 mg/dL (ref 70–99)
Potassium: 3 meq/L — ABNORMAL LOW (ref 3.5–5.1)
Sodium: 137 meq/L (ref 135–145)

## 2023-09-19 MED ORDER — POTASSIUM CHLORIDE CRYS ER 20 MEQ PO TBCR: 20.0000 meq | EXTENDED_RELEASE_TABLET | Freq: Two times a day (BID) | ORAL | 3 refills | Status: AC

## 2023-09-19 NOTE — Progress Notes (Signed)
 Assessment & Plan:  Hypertension, unspecified type Assessment & Plan: Chronic, stable.  Continue amlodipine  5 mg daily, hydrochlorothiazide  25 mg daily and potassium chloride  20 mEq daily.  Discussed in setting of expected weight loss, she may need to decrease amlodipine  to 2.5 mg and/or completely stop medication.  She will monitor blood pressure at home and let me know how she is doing  Orders: -     Basic metabolic panel with GFR  Annual physical exam Assessment & Plan: Deferred clinical breast exam due to patient preference.  Deferred pelvic exam in the absence of complaints and patient has had a full hysterectomy and she doesn't have cervix.  Encouraged continued exercise.  She politely declines second Shingrix  vaccine      Return precautions given.   Risks, benefits, and alternatives of the medications and treatment plan prescribed today were discussed, and patient expressed understanding.   Education regarding symptom management and diagnosis given to patient on AVS either electronically or printed.  Return in about 3 months (around 12/20/2023).  Bascom Bossier, FNP  Subjective:    Patient ID: Jennifer Rowland, female    DOB: 1971-03-24, 53 y.o.   MRN: [id number]  CC: Jennifer Rowland is a 53 y.o. female who presents today for physical exam.    HPI: She feels well today No new complaints   She follows annually with dermatology She is taking semaglutide  compound.  She is tolerating well without constipation.   Compliant with hydrochlorothiazide  25mg , amlodipine  5mg   She is taking potassium chloride  20meq daily  Colorectal Cancer Screening: UTD , 01/10/23, repeat in 10 years Breast Cancer Screening: Mammogram UTD Cervical Cancer Screening: h/o hysterectomy d/t menorrhagia, NO CERVIX based on surgical pathology, no h/o GYN cancer.  Bone Health screening/DEXA for 65+: No increased fracture risk. Defer screening at this time.        Tetanus - due; declines  today  Exercise: Gets regular exercise pilates, playing with grandchildren.   Alcohol use:  occcassional Smoking/tobacco use: Nonsmoker.    Health Maintenance  Topic Date Due   DTaP/Tdap/Td vaccine (1 - Tdap) Never done   COVID-19 Vaccine (4 - 2024-25 season) 12/16/2022   Zoster (Shingles) Vaccine (2 of 2) 06/28/2023   Flu Shot  11/15/2023   Mammogram  01/15/2025   Colon Cancer Screening  01/09/2033   HPV Vaccine  Aged Out   Meningitis B Vaccine  Aged Out   Hepatitis C Screening  Discontinued   HIV Screening  Discontinued    ALLERGIES: Percocet [oxycodone -acetaminophen ]  Current Outpatient Medications on File Prior to Visit  Medication Sig Dispense Refill   amLODipine  (NORVASC ) 5 MG tablet Take 1 tablet (5 mg total) by mouth daily. 90 tablet 3   amphetamine -dextroamphetamine  (ADDERALL) 15 MG tablet Take 1 tablet by mouth daily. Take 15 mg by mouth daily. 30 tablet 0   amphetamine -dextroamphetamine  (ADDERALL) 15 MG tablet Take 1 tablet by mouth daily. 30 tablet 0   estradiol  (ESTRACE ) 1 MG tablet Take 1 tablet (1 mg total) by mouth daily.     hydrochlorothiazide  (HYDRODIURIL ) 25 MG tablet Take 1 tablet (25 mg total) by mouth daily. 90 tablet 3   Multiple Vitamin (MULTIVITAMIN) capsule Take 1 capsule by mouth daily.     potassium chloride  SA (KLOR-CON  M) 20 MEQ tablet Take 1 tablet (20 mEq total) by mouth daily. 90 tablet 3   SEMAGLUTIDE -WEIGHT MANAGEMENT [place] Inject 1 mg into the skin once a week.     Vitamin D , Cholecalciferol, 25 MCG (  1000 UT) CAPS Take 1,000 Units by mouth daily as needed (Vitamin).     amphetamine -dextroamphetamine  (ADDERALL) 15 MG tablet Take 1 tablet by mouth daily. 30 tablet 0   No current facility-administered medications on file prior to visit.    Review of Systems  Constitutional:  Negative for chills and fever.  Respiratory:  Negative for cough.   Cardiovascular:  Negative for chest pain and palpitations.  Gastrointestinal:  Negative for nausea and  vomiting.      Objective:    BP 130/70   Pulse 76   Temp 97.8 F (36.6 C) (Oral)   Ht 5\' 3"  (1.6 m)   Wt 156 lb 3.2 oz (70.9 kg)   LMP 12/19/2020 (Approximate)   SpO2 96%   BMI 27.67 kg/m   BP Readings from Last 3 Encounters:  09/19/23 130/70  07/16/23 (!) 140/75  06/05/23 (!) 141/80   Wt Readings from Last 3 Encounters:  09/19/23 156 lb 3.2 oz (70.9 kg)  07/16/23 166 lb (75.3 kg)  06/05/23 162 lb (73.5 kg)    Physical Exam Vitals reviewed.  Constitutional:      Appearance: Normal appearance. She is well-developed.  Eyes:     Conjunctiva/sclera: Conjunctivae normal.  Neck:     Thyroid : No thyroid  mass or thyromegaly.  Cardiovascular:     Rate and Rhythm: Normal rate and regular rhythm.     Pulses: Normal pulses.     Heart sounds: Normal heart sounds.  Pulmonary:     Effort: Pulmonary effort is normal.     Breath sounds: Normal breath sounds. No wheezing, rhonchi or rales.  Abdominal:     General: Bowel sounds are normal. There is no distension.     Palpations: Abdomen is soft. Abdomen is not rigid. There is no fluid wave or mass.     Tenderness: There is no abdominal tenderness. There is no guarding or rebound.  Lymphadenopathy:     Head:     Right side of head: No submental, submandibular, tonsillar, preauricular, posterior auricular or occipital adenopathy.     Left side of head: No submental, submandibular, tonsillar, preauricular, posterior auricular or occipital adenopathy.     Cervical: No cervical adenopathy.  Skin:    General: Skin is warm and dry.  Neurological:     Mental Status: She is alert.  Psychiatric:        Speech: Speech normal.        Behavior: Behavior normal.        Thought Content: Thought content normal.

## 2023-09-19 NOTE — Assessment & Plan Note (Signed)
 Chronic, stable.  Continue amlodipine  5 mg daily, hydrochlorothiazide  25 mg daily and potassium chloride  20 mEq daily.  Discussed in setting of expected weight loss, she may need to decrease amlodipine  to 2.5 mg and/or completely stop medication.  She will monitor blood pressure at home and let me know how she is doing

## 2023-09-19 NOTE — Patient Instructions (Signed)
 Health Maintenance for Postmenopausal Women Menopause is a normal process in which your ability to get pregnant comes to an end. This process happens slowly over many months or years, usually between the ages of 24 and 62. Menopause is complete when you have missed your menstrual period for 12 months. It is important to talk with your health care provider about some of the most common conditions that affect women after menopause (postmenopausal women). These include heart disease, cancer, and bone loss (osteoporosis). Adopting a healthy lifestyle and getting preventive care can help to promote your health and wellness. The actions you take can also lower your chances of developing some of these common conditions. What are the signs and symptoms of menopause? During menopause, you may have the following symptoms: Hot flashes. These can be moderate or severe. Night sweats. Decrease in sex drive. Mood swings. Headaches. Tiredness (fatigue). Irritability. Memory problems. Problems falling asleep or staying asleep. Talk with your health care provider about treatment options for your symptoms. Do I need hormone replacement therapy? Hormone replacement therapy is effective in treating symptoms that are caused by menopause, such as hot flashes and night sweats. Hormone replacement carries certain risks, especially as you become older. If you are thinking about using estrogen or estrogen with progestin, discuss the benefits and risks with your health care provider. How can I reduce my risk for heart disease and stroke? The risk of heart disease, heart attack, and stroke increases as you age. One of the causes may be a change in the body's hormones during menopause. This can affect how your body uses dietary fats, triglycerides, and cholesterol. Heart attack and stroke are medical emergencies. There are many things that you can do to help prevent heart disease and stroke. Watch your blood pressure High  blood pressure causes heart disease and increases the risk of stroke. This is more likely to develop in people who have high blood pressure readings or are overweight. Have your blood pressure checked: Every 3-5 years if you are 52-75 years of age. Every year if you are 77 years old or older. Eat a healthy diet  Eat a diet that includes plenty of vegetables, fruits, low-fat dairy products, and lean protein. Do not eat a lot of foods that are high in solid fats, added sugars, or sodium. Get regular exercise Get regular exercise. This is one of the most important things you can do for your health. Most adults should: Try to exercise for at least 150 minutes each week. The exercise should increase your heart rate and make you sweat (moderate-intensity exercise). Try to do strengthening exercises at least twice each week. Do these in addition to the moderate-intensity exercise. Spend less time sitting. Even light physical activity can be beneficial. Other tips Work with your health care provider to achieve or maintain a healthy weight. Do not use any products that contain nicotine or tobacco. These products include cigarettes, chewing tobacco, and vaping devices, such as e-cigarettes. If you need help quitting, ask your health care provider. Know your numbers. Ask your health care provider to check your cholesterol and your blood sugar (glucose). Continue to have your blood tested as directed by your health care provider. Do I need screening for cancer? Depending on your health history and family history, you may need to have cancer screenings at different stages of your life. This may include screening for: Breast cancer. Cervical cancer. Lung cancer. Colorectal cancer. What is my risk for osteoporosis? After menopause, you may be  at increased risk for osteoporosis. Osteoporosis is a condition in which bone destruction happens more quickly than new bone creation. To help prevent osteoporosis or  the bone fractures that can happen because of osteoporosis, you may take the following actions: If you are 52-3 years old, get at least 1,000 mg of calcium and at least 600 international units (IU) of vitamin D per day. If you are older than age 52 but younger than age 75, get at least 1,200 mg of calcium and at least 600 international units (IU) of vitamin D per day. If you are older than age 52, get at least 1,200 mg of calcium and at least 800 international units (IU) of vitamin D per day. Smoking and drinking excessive alcohol increase the risk of osteoporosis. Eat foods that are rich in calcium and vitamin D, and do weight-bearing exercises several times each week as directed by your health care provider. How does menopause affect my mental health? Depression may occur at any age, but it is more common as you become older. Common symptoms of depression include: Feeling depressed. Changes in sleep patterns. Changes in appetite or eating patterns. Feeling an overall lack of motivation or enjoyment of activities that you previously enjoyed. Frequent crying spells. Talk with your health care provider if you think that you are experiencing any of these symptoms. General instructions See your health care provider for regular wellness exams and vaccines. This may include: Scheduling regular health, dental, and eye exams. Getting and maintaining your vaccines. These include: Influenza vaccine. Get this vaccine each year before the flu season begins. Pneumonia vaccine. Shingles vaccine. Tetanus, diphtheria, and pertussis (Tdap) booster vaccine. Your health care provider may also recommend other immunizations. Tell your health care provider if you have ever been abused or do not feel safe at home. Summary Menopause is a normal process in which your ability to get pregnant comes to an end. This condition causes hot flashes, night sweats, decreased interest in sex, mood swings, headaches, or lack  of sleep. Treatment for this condition may include hormone replacement therapy. Take actions to keep yourself healthy, including exercising regularly, eating a healthy diet, watching your weight, and checking your blood pressure and blood sugar levels. Get screened for cancer and depression. Make sure that you are up to date with all your vaccines. This information is not intended to replace advice given to you by your health care provider. Make sure you discuss any questions you have with your health care provider. Document Revised: 08/22/2020 Document Reviewed: 08/22/2020 Elsevier Patient Education  2024 [employer].

## 2023-09-19 NOTE — Assessment & Plan Note (Addendum)
 Deferred clinical breast exam due to patient preference.  Deferred pelvic exam in the absence of complaints and patient has had a full hysterectomy and she doesn't have cervix.  Encouraged continued exercise.  She politely declines second Shingrix  vaccine

## 2023-09-26 ENCOUNTER — Other Ambulatory Visit

## 2023-09-30 ENCOUNTER — Other Ambulatory Visit (INDEPENDENT_AMBULATORY_CARE_PROVIDER_SITE_OTHER)

## 2023-09-30 ENCOUNTER — Ambulatory Visit: Payer: Self-pay | Admitting: Family

## 2023-09-30 DIAGNOSIS — E876 Hypokalemia: Secondary | ICD-10-CM | POA: Diagnosis not present

## 2023-09-30 LAB — BASIC METABOLIC PANEL WITH GFR
BUN: 19 mg/dL (ref 6–23)
CO2: 32 meq/L (ref 19–32)
Calcium: 9.8 mg/dL (ref 8.4–10.5)
Chloride: 97 meq/L (ref 96–112)
Creatinine, Ser: 0.93 mg/dL (ref 0.40–1.20)
GFR: 71.1 mL/min (ref >60.00)
Glucose, Bld: 97 mg/dL (ref 70–99)
Potassium: 3.7 meq/L (ref 3.5–5.1)
Sodium: 138 meq/L (ref 135–145)

## 2023-10-25 ENCOUNTER — Encounter: Payer: Self-pay | Admitting: Family

## 2023-10-28 ENCOUNTER — Other Ambulatory Visit: Payer: Self-pay | Admitting: Family

## 2023-10-28 DIAGNOSIS — F909 Attention-deficit hyperactivity disorder, unspecified type: Secondary | ICD-10-CM

## 2023-10-28 MED ORDER — AMPHETAMINE-DEXTROAMPHETAMINE 15 MG PO TABS: 15.0000 mg | ORAL_TABLET | Freq: Every day | ORAL | 0 refills | Status: DC

## 2023-12-20 ENCOUNTER — Telehealth: Admitting: Family

## 2023-12-20 ENCOUNTER — Encounter: Payer: Self-pay | Admitting: Family

## 2023-12-20 ENCOUNTER — Telehealth: Payer: Self-pay | Admitting: Family

## 2023-12-20 VITALS — Ht 63.0 in | Wt 156.2 lb

## 2023-12-20 DIAGNOSIS — F909 Attention-deficit hyperactivity disorder, unspecified type: Secondary | ICD-10-CM | POA: Diagnosis not present

## 2023-12-20 DIAGNOSIS — I10 Essential (primary) hypertension: Secondary | ICD-10-CM

## 2023-12-20 DIAGNOSIS — Z1231 Encounter for screening mammogram for malignant neoplasm of breast: Secondary | ICD-10-CM

## 2023-12-20 NOTE — Telephone Encounter (Signed)
 Called [facility] they stated that they were  getting it ready pt could pick up later today, pt has been notified

## 2023-12-20 NOTE — Progress Notes (Signed)
 Virtual Visit via Video Note  I connected with Jennifer Rowland on 12/24/23 at  9:00 AM EDT by a video enabled telemedicine application and verified that I am speaking with the correct person using two identifiers. Location patient: home Location provider: work  Persons participating in the virtual visit: patient, provider  I discussed the limitations of evaluation and management by telemedicine and the availability of in person appointments. The patient expressed understanding and agreed to proceed.  HPI:  Medication follow up She stopped compounded semaglutide  due to cost and lack of effectiveness.  She feels well on Adderall 15 mg once daily.  Denies increased anxiety, palpitations.  She remains compliant with amlodipine  5 mg daily, hydrochlorothiazide  25 mg daily.  ROS: See pertinent positives and negatives per HPI.  EXAM:  VITALS per patient if applicable: Ht 5' 3 (1.6 m)   Wt 156 lb 3.2 oz (70.9 kg)   LMP 12/19/2020 (Approximate)   BMI 27.67 kg/m  BP Readings from Last 3 Encounters:  09/19/23 130/70  07/16/23 (!) 140/75  06/05/23 (!) 141/80   Wt Readings from Last 3 Encounters:  12/20/23 156 lb 3.2 oz (70.9 kg)  09/19/23 156 lb 3.2 oz (70.9 kg)  07/16/23 166 lb (75.3 kg)    GENERAL: alert, oriented, appears well and in no acute distress  HEENT: atraumatic, conjunttiva clear, no obvious abnormalities on inspection of external nose and ears  NECK: normal movements of the head and neck  LUNGS: on inspection no signs of respiratory distress, breathing rate appears normal, no obvious gross SOB, gasping or wheezing  CV: no obvious cyanosis  MS: moves all visible extremities without noticeable abnormality  PSYCH/NEURO: pleasant and cooperative, no obvious depression or anxiety, speech and thought processing grossly intact  ASSESSMENT AND PLAN: Encounter for screening mammogram for malignant neoplasm of breast -     3D Screening Mammogram, Left and Right;  Future  Attention deficit hyperactivity disorder (ADHD), unspecified ADHD type Assessment & Plan: Chronic, Stable without side effects.  Continue Adderall 15 mg daily.     Hypertension, unspecified type Assessment & Plan: Chronic, stable.  Continue amlodipine  5 mg daily, hydrochlorothiazide  25 mg daily and potassium chloride  20 mEq daily.        -we discussed possible serious and likely etiologies, options for evaluation and workup, limitations of telemedicine visit vs in person visit, treatment, treatment risks and precautions. Pt prefers to treat via telemedicine empirically rather then risking or undertaking an in person visit at this moment.    I discussed the assessment and treatment plan with the patient. The patient was provided an opportunity to ask questions and all were answered. The patient agreed with the plan and demonstrated an understanding of the instructions.   The patient was advised to call back or seek an in-person evaluation if the symptoms worsen or if the condition fails to improve as anticipated.  Advised if desired AVS can be mailed or viewed via MyChart if Mychart user.   Rollene Northern, FNP

## 2023-12-20 NOTE — Telephone Encounter (Signed)
 Call pharm APP is not letting her refill adderall She has rx for 11/28/23 and 12/29/23; please confirm and let pt know

## 2023-12-24 NOTE — Assessment & Plan Note (Signed)
 Chronic, stable.  Continue amlodipine  5 mg daily, hydrochlorothiazide  25 mg daily and potassium chloride  20 mEq daily.

## 2023-12-24 NOTE — Assessment & Plan Note (Signed)
 Chronic, Stable without side effects.  Continue Adderall 15 mg daily.

## 2024-01-03 ENCOUNTER — Encounter: Payer: Self-pay | Admitting: Family

## 2024-01-03 DIAGNOSIS — Z1231 Encounter for screening mammogram for malignant neoplasm of breast: Secondary | ICD-10-CM

## 2024-01-23 ENCOUNTER — Ambulatory Visit
Admission: RE | Admit: 2024-01-23 | Discharge: 2024-01-23 | Disposition: A | Discharge status: Home / Self Care | Source: Ambulatory Visit | Attending: Family | Admitting: Family

## 2024-01-23 DIAGNOSIS — Z1231 Encounter for screening mammogram for malignant neoplasm of breast: Secondary | ICD-10-CM | POA: Insufficient documentation

## 2024-04-20 ENCOUNTER — Encounter: Payer: Self-pay | Admitting: Family

## 2024-04-20 NOTE — Telephone Encounter (Signed)
 Called and scheduled pt

## 2024-05-13 ENCOUNTER — Ambulatory Visit: Admitting: Family

## 2024-05-13 ENCOUNTER — Encounter: Payer: Self-pay | Admitting: Family

## 2024-05-13 VITALS — BP 130/68 | HR 78 | Temp 98.5°F | Ht 63.0 in | Wt 161.2 lb

## 2024-05-13 DIAGNOSIS — Z13220 Encounter for screening for lipoid disorders: Secondary | ICD-10-CM | POA: Diagnosis not present

## 2024-05-13 DIAGNOSIS — F909 Attention-deficit hyperactivity disorder, unspecified type: Secondary | ICD-10-CM

## 2024-05-13 DIAGNOSIS — Z136 Encounter for screening for cardiovascular disorders: Secondary | ICD-10-CM

## 2024-05-13 DIAGNOSIS — E663 Overweight: Secondary | ICD-10-CM | POA: Diagnosis not present

## 2024-05-13 DIAGNOSIS — G4700 Insomnia, unspecified: Secondary | ICD-10-CM | POA: Diagnosis not present

## 2024-05-13 DIAGNOSIS — E876 Hypokalemia: Secondary | ICD-10-CM

## 2024-05-13 DIAGNOSIS — I10 Essential (primary) hypertension: Secondary | ICD-10-CM

## 2024-05-13 DIAGNOSIS — Z6828 Body mass index (BMI) 28.0-28.9, adult: Secondary | ICD-10-CM

## 2024-05-13 DIAGNOSIS — Z79890 Hormone replacement therapy (postmenopausal): Secondary | ICD-10-CM | POA: Diagnosis not present

## 2024-05-13 MED ORDER — WEGOVY 4 MG PO TABS: 4.0000 mg | ORAL_TABLET | Freq: Every day | ORAL | 2 refills | Status: DC

## 2024-05-13 NOTE — Progress Notes (Signed)
 "  Assessment & Plan:  Hypertension, unspecified type Assessment & Plan: Chronic, stable.  Continue amlodipine  5 mg daily, hydrochlorothiazide  25 mg daily and potassium chloride  20 mEq BID   Orders: -     Comprehensive metabolic panel with GFR  Low blood potassium  Hormone replacement therapy (HRT)  Encounter for lipid screening for cardiovascular disease  Attention deficit hyperactivity disorder (ADHD), unspecified ADHD type -     Amphetamine -Dextroamphetamine ; Take 1 tablet by mouth daily.  Dispense: 30 tablet; Refill: 0 -     Amphetamine -Dextroamphetamine ; Take 1 tablet by mouth daily. Take 15 mg by mouth daily.  Dispense: 30 tablet; Refill: 0 -     Amphetamine -Dextroamphetamine ; Take 1 tablet by mouth daily.  Dispense: 30 tablet; Refill: 0  Overweight (BMI 25.0-29.9) Assessment & Plan: BMI greater than 25 with weight associated comorbidities, hypertension, hyperlipidemia.  Start Wegovy  4mg  orally.  She has tolerated compounded semaglutide  50 units and does not want to start at the initial dose of Wegovy  1.5 mg due to being on semaglutide  for months.  we discussed the increased risk of GI complications including constipation and nausea by not starting at 1.5mg .  She understands to monitor for GI side effects closely as it is difficult to ascertain 50 units semaglutide  and what equal mg dose that may be.  Counseled on mechanism action, side effects, blackbox warning as it relates to GLP-1 agonist.  Close follow-up.   Insomnia, unspecified type Assessment & Plan: Chronic, suboptimal control.  Trial of trazodone  25 to 50 mg nightly and titrate   Other orders -     Wegovy ; Take 1 tablet (4 mg total) by mouth daily. Daily in morning on an empty stomach with 4 oz of water. Do not eat or drink for 30 minutes after dose.  Dispense: 30 tablet; Refill: 2 -     traZODone  HCl; Take 0.5-1 tablets (25-50 mg total) by mouth at bedtime as needed for sleep.  Dispense: 30 tablet; Refill: 3      Return precautions given.   Risks, benefits, and alternatives of the medications and treatment plan prescribed today were discussed, and patient expressed understanding.   Education regarding symptom management and diagnosis given to patient on AVS either electronically or printed.  Return in about 3 months (around 08/11/2024).  Rollene Northern, FNP  Subjective:    Patient ID: Jennifer Rowland, female    DOB: Mar 01, 1971, 54 y.o.   MRN: [id number]  CC: Jennifer Rowland is a 54 y.o. female who presents today for follow up.   HPI: HPI Discussed the use of AI scribe software for clinical note transcription with the patient, who gave verbal consent to proceed.  History of Present Illness   Jennifer Rowland is a 54 year old female with hypertension who presents for follow-up regarding medication management and weight loss concerns.  No leg swelling since the addition of hydrochlorothiazide .  She remains compliant potassium chloride  20 mEq BID  She has been using compounded semaglutide  for weight loss since October, starting at a low dose and gradually increasing to 50 units per week. Despite following dietary recommendations and ensuring adequate protein intake, there are no significant changes in her appetite or weight. She previously had success with Mounjaro  for weight loss, which she found more effective.  She experiences sleep disturbances, particularly difficulty falling asleep and staying asleep, which she attributes to menopausal symptoms. Melatonin has been ineffective, as it does not induce sleepiness until much later.    She has an  upcoming eye appointment.   Compliant with Adderall 50 mg daily.  She does not take this medication daily.  She request a refill. She remains compliant with amlodipine  5 mg daily, hydrochlorothiazide  25 mg daily    Starting GLP agonist / GIP:  Denies family history medullary thyroid  cancer, multiple endocrine neoplasia. Denies personal history of  pancreatitis, multiple endocrine neoplasia, chronic constipation, history of ileus.  Denies history of cholelithiasis and cholecystitis Denies feeling of mass in the neck, dysphagia, dyspnea, persistent hoarseness    No known history of retinopathy.   Allergies: Percocet [oxycodone -acetaminophen ] Medications Ordered Prior to Encounter[1]  Review of Systems  Constitutional:  Negative for chills and fever.  Respiratory:  Negative for cough and shortness of breath.   Cardiovascular:  Negative for chest pain, palpitations and leg swelling.  Gastrointestinal:  Negative for constipation, nausea and vomiting.      Objective:    BP 130/68   Pulse 78   Temp 98.5 F (36.9 C) (Oral)   Ht 5' 3 (1.6 m)   Wt 161 lb 3.2 oz (73.1 kg)   LMP 12/19/2020   SpO2 99%   BMI 28.56 kg/m  BP Readings from Last 3 Encounters:  05/13/24 130/68  09/19/23 130/70  07/16/23 (!) 140/75   Wt Readings from Last 3 Encounters:  05/13/24 161 lb 3.2 oz (73.1 kg)  12/20/23 156 lb 3.2 oz (70.9 kg)  09/19/23 156 lb 3.2 oz (70.9 kg)    Physical Exam Vitals reviewed.  Constitutional:      Appearance: She is well-developed.  Eyes:     Conjunctiva/sclera: Conjunctivae normal.  Cardiovascular:     Rate and Rhythm: Normal rate and regular rhythm.     Pulses: Normal pulses.     Heart sounds: Normal heart sounds.  Pulmonary:     Effort: Pulmonary effort is normal.     Breath sounds: Normal breath sounds. No wheezing, rhonchi or rales.  Musculoskeletal:     Right lower leg: No edema.     Left lower leg: No edema.  Skin:    General: Skin is warm and dry.  Neurological:     Mental Status: She is alert.  Psychiatric:        Speech: Speech normal.        Behavior: Behavior normal.        Thought Content: Thought content normal.            [1]  Current Outpatient Medications on File Prior to Visit  Medication Sig Dispense Refill   Magnesium 250 MG CAPS Take by mouth.     amLODipine  (NORVASC ) 5 MG  tablet Take 1 tablet (5 mg total) by mouth daily. 90 tablet 3   estradiol  (ESTRACE ) 1 MG tablet Take 1 tablet (1 mg total) by mouth daily.     hydrochlorothiazide  (HYDRODIURIL ) 25 MG tablet Take 1 tablet (25 mg total) by mouth daily. 90 tablet 3   Multiple Vitamin (MULTIVITAMIN) capsule Take 1 capsule by mouth daily.     potassium chloride  SA (KLOR-CON  M) 20 MEQ tablet Take 1 tablet (20 mEq total) by mouth 2 (two) times daily. 180 tablet 3   No current facility-administered medications on file prior to visit.   "

## 2024-05-13 NOTE — Patient Instructions (Addendum)
 Trial of wegovy  PILL 4mg  for 30 days.   Please reference to wegovy .com manufacture website in regards to cost.   Coupon is online or they can text SAVE to [phone].    https://www.wegovy .com/obesity/what-to-pay-for-wegovy .html?tc=ps_[email or web address]&cc=2003&utm_medium=cpc&utm_source=miso&utm_campaign=wegovy_oral_launch&utm_content=product_information_[email or web address]&showisi=true&gclid=[email or web address]&gclsrc=3p.ds&&utm_source=bing&utm_medium=cpc&utm_term=wegovy %20savings&utm_campaign=&utm_content=-dc_pcrid_[id number]_pkw_wegovy%20savings_pmt_bp_slid__product_&pgrid=[id number]&ptaid=[id number]:loc-190&[id number]   It is important to take this medication on an empty stomach and with 4 ounces of water.  Wait at least 30 minutes before eating, drinking or taking other oral medications.   We have discussed starting non insulin daily injectable medication called Wegovy   which is a glucagon like peptide (GLP 1) agonist and works by delaying gastric emptying and increasing insulin secretion.It is given once per week. Most patients see significant weight loss with this drug class.   You may NOT take either medication if you or your family has history of thyroid , parathyroid, OR adrenal cancer. Please confirm you and your family does NOT have this history as this drug class has black box warning on this medication for that reason.   Please follow  directions on prescription and slowly increase the medication for your safety over time.   We can slowly titrate further at follow up with goal of no more than 1-2 lbs weight loss per week.  Semaglutide  (Wegovy )  Dose (mg) Once Weekly Titration:    If a dose is not tolerated, consider delaying further dose increases for  another 4 weeks.  If you are actively losing weight on a dose, do not increase medication.   Days 1-30  1.5mg  daily  Days 31-60  4 mg daily  Days 61-90 9mg  daily  Maintenance  Dose 25mg  daily

## 2024-05-14 ENCOUNTER — Ambulatory Visit: Payer: Self-pay | Admitting: Family

## 2024-05-14 LAB — COMPREHENSIVE METABOLIC PANEL WITH GFR
ALT: 35 U/L (ref 3–35)
AST: 20 U/L (ref 5–37)
Albumin: 4.4 g/dL (ref 3.5–5.2)
Alkaline Phosphatase: 71 U/L (ref 39–117)
BUN: 18 mg/dL (ref 6–23)
CO2: 30 meq/L (ref 19–32)
Calcium: 9.7 mg/dL (ref 8.4–10.5)
Chloride: 102 meq/L (ref 96–112)
Creatinine, Ser: 1.01 mg/dL (ref 0.40–1.20)
GFR: 64.12 mL/min
Glucose, Bld: 87 mg/dL (ref 70–99)
Potassium: 4.2 meq/L (ref 3.5–5.1)
Sodium: 139 meq/L (ref 135–145)
Total Bilirubin: 0.3 mg/dL (ref 0.2–1.2)
Total Protein: 7.3 g/dL (ref 6.0–8.3)

## 2024-05-14 MED ORDER — AMPHETAMINE-DEXTROAMPHETAMINE 15 MG PO TABS: 15.0000 mg | ORAL_TABLET | Freq: Every day | ORAL | 0 refills | Status: AC

## 2024-05-14 MED ORDER — TRAZODONE HCL 50 MG PO TABS: 25.0000 mg | ORAL_TABLET | Freq: Every evening | ORAL | 3 refills | Status: AC | PRN

## 2024-05-14 NOTE — Telephone Encounter (Signed)
 Copied from CRM [id number]. Topic: Clinical - Prescription Issue >> May 14, 2024 10:48 AM Revonda D wrote: Reason for CRM: Pt stated that the pharmacy informed her that the semaglutide -weight management (WEGOVY ) 4 MG tablet needs to be updated before they can fill the medication. Pt also stated that they informed her that they haven't receive a request for the trazodone  and wants to know if this medication can be sent to the pharmacy today. Pt would like a callback with an update.

## 2024-05-15 NOTE — Assessment & Plan Note (Addendum)
 Chronic, stable.  Continue amlodipine  5 mg daily, hydrochlorothiazide  25 mg daily and potassium chloride  20 mEq BID

## 2024-05-15 NOTE — Assessment & Plan Note (Addendum)
 BMI greater than 25 with weight associated comorbidities, hypertension, hyperlipidemia.  Start Wegovy  4mg  orally.  She has tolerated compounded semaglutide  50 units and does not want to start at the initial dose of Wegovy  1.5 mg due to being on semaglutide  for months.  we discussed the increased risk of GI complications including constipation and nausea by not starting at 1.5mg .  She understands to monitor for GI side effects closely as it is difficult to ascertain 50 units semaglutide  and what equal mg dose that may be.  Counseled on mechanism action, side effects, blackbox warning as it relates to GLP-1 agonist.  Close follow-up.

## 2024-05-15 NOTE — Assessment & Plan Note (Signed)
 Chronic, suboptimal control.  Trial of trazodone  25 to 50 mg nightly and titrate

## 2024-05-21 ENCOUNTER — Other Ambulatory Visit: Payer: Self-pay | Admitting: Family

## 2024-05-21 MED ORDER — WEGOVY 9 MG PO TABS: 9.0000 mg | ORAL_TABLET | Freq: Every day | ORAL | 1 refills | Status: AC

## 2024-05-21 NOTE — Telephone Encounter (Signed)
 Spoke to pt she has picked up rx and is feeling fine on the dosage would like the next dose for next mnth as long as she does well on this one.

## 2024-05-22 ENCOUNTER — Ambulatory Visit: Admitting: Family

## 2024-09-10 ENCOUNTER — Ambulatory Visit: Admitting: Family
